# Patient Record
Sex: Male | Born: 1960 | Race: Black or African American | Hispanic: No | Marital: Married | Smoking: Never smoker
Health system: Southern US, Community
[De-identification: ages and names within clinical notes are randomized; demographics above are authoritative.]

## PROBLEM LIST (undated history)

## (undated) DIAGNOSIS — E785 Hyperlipidemia, unspecified: Secondary | ICD-10-CM

## (undated) DIAGNOSIS — R011 Cardiac murmur, unspecified: Secondary | ICD-10-CM

## (undated) DIAGNOSIS — I10 Essential (primary) hypertension: Secondary | ICD-10-CM

## (undated) DIAGNOSIS — T7840XA Allergy, unspecified, initial encounter: Secondary | ICD-10-CM

## (undated) DIAGNOSIS — K219 Gastro-esophageal reflux disease without esophagitis: Secondary | ICD-10-CM

## (undated) HISTORY — DX: Allergy, unspecified, initial encounter: T78.40XA

## (undated) HISTORY — DX: Cardiac murmur, unspecified: R01.1

## (undated) HISTORY — DX: Gastro-esophageal reflux disease without esophagitis: K21.9

## (undated) HISTORY — DX: Hyperlipidemia, unspecified: E78.5

---

## 2007-11-24 ENCOUNTER — Emergency Department (HOSPITAL_COMMUNITY): Admission: EM | Admit: 2007-11-24 | Discharge: 2007-11-24 | Payer: Self-pay | Admitting: Family Medicine

## 2009-02-20 HISTORY — PX: COLONOSCOPY: SHX174

## 2013-01-27 ENCOUNTER — Emergency Department (HOSPITAL_COMMUNITY)
Admission: EM | Admit: 2013-01-27 | Discharge: 2013-01-27 | Disposition: A | Discharge status: Home / Self Care | Payer: Federal, State, Local not specified - PPO | Attending: Emergency Medicine | Admitting: Emergency Medicine

## 2013-01-27 ENCOUNTER — Encounter (HOSPITAL_COMMUNITY): Payer: Self-pay | Admitting: Emergency Medicine

## 2013-01-27 DIAGNOSIS — J3489 Other specified disorders of nose and nasal sinuses: Secondary | ICD-10-CM

## 2013-01-27 DIAGNOSIS — M542 Cervicalgia: Secondary | ICD-10-CM | POA: Insufficient documentation

## 2013-01-27 DIAGNOSIS — H9319 Tinnitus, unspecified ear: Secondary | ICD-10-CM | POA: Insufficient documentation

## 2013-01-27 DIAGNOSIS — I10 Essential (primary) hypertension: Secondary | ICD-10-CM | POA: Insufficient documentation

## 2013-01-27 DIAGNOSIS — H9209 Otalgia, unspecified ear: Secondary | ICD-10-CM | POA: Insufficient documentation

## 2013-01-27 DIAGNOSIS — R599 Enlarged lymph nodes, unspecified: Secondary | ICD-10-CM | POA: Insufficient documentation

## 2013-01-27 HISTORY — DX: Essential (primary) hypertension: I10

## 2013-01-27 MED ORDER — AMOXICILLIN 500 MG PO CAPS: 500.0000 mg | ORAL_CAPSULE | Freq: Three times a day (TID) | ORAL | Status: DC

## 2013-01-27 MED ORDER — GUAIFENESIN ER 600 MG PO TB12: 600.0000 mg | ORAL_TABLET | Freq: Two times a day (BID) | ORAL | Status: AC | PRN

## 2013-01-27 MED ORDER — HYDROCODONE-ACETAMINOPHEN 5-500 MG PO TABS: 1.0000 | ORAL_TABLET | Freq: Four times a day (QID) | ORAL | Status: DC | PRN

## 2013-01-27 NOTE — ED Provider Notes (Signed)
CSN: [id number]     Arrival date & time 01/27/13  1232 History   First MD Initiated Contact with Patient 01/27/13 1306    This chart was scribed for Fayrene Helper PA-C, a non-physician practitioner working with Darlys Gales, MD by Lewanda Rife, ED Scribe. This patient was seen in room TR05C/TR05C and the patient's care was started at 1:13 PM     Chief Complaint  Patient presents with  . Facial Pain   (Consider location/radiation/quality/duration/timing/severity/associated sxs/prior Treatment) The history is provided by the patient. No language interpreter was used.   HPI Comments: Micheal Potts is a 52 y.o. male who presents to the Emergency Department complaining of constant worsening left sided facial pain radiating to left ear onset 2 days. Reports associated nasal congestion, intermittent tinnitus, left sided neck pain, and sinus pressure. Describes pain as throbbing and moderate in severity. Reports trying OTC sinus medications, and Afrin with no relief of symptoms. Denies associated injury, fever, rash, chills, dental pain, diplopia, chest pain, shortness of breath, nausea, emesis, and diarrhea.  Reports PMHx of recurrent sinus infections.  Past Medical History  Diagnosis Date  . Hypertension    History reviewed. No pertinent past surgical history. No family history on file. History  Substance Use Topics  . Smoking status: Never Smoker   . Smokeless tobacco: Not on file  . Alcohol Use: No    Review of Systems  Constitutional: Negative for fever and chills.  HENT: Positive for ear pain. Negative for dental problem.   Eyes: Negative for visual disturbance.  Respiratory: Negative for shortness of breath.   Cardiovascular: Negative for chest pain.  Gastrointestinal: Negative for nausea, vomiting and diarrhea.  Musculoskeletal: Negative for neck pain.  Skin: Negative for rash.  Neurological: Negative for numbness.  Hematological: Positive for adenopathy.   Psychiatric/Behavioral: Negative for confusion.    Allergies  Review of patient's allergies indicates no known allergies.  Home Medications  No current outpatient prescriptions on file. BP 158/103  Pulse 63  Temp(Src) 98.8 F (37.1 C) (Oral)  Resp 14  Ht 5\' 10"  (1.778 m)  Wt 215 lb (97.523 kg)  BMI 30.85 kg/m2  SpO2 100% Physical Exam  Nursing note and vitals reviewed. Constitutional: He is oriented to person, place, and time. He appears well-developed and well-nourished. No distress.  HENT:  Head: Normocephalic and atraumatic.  Right Ear: External ear and ear canal normal. Tympanic membrane is bulging.  Left Ear: External ear and ear canal normal. Tympanic membrane is bulging.  Nose: Right sinus exhibits no maxillary sinus tenderness and no frontal sinus tenderness. Left sinus exhibits maxillary sinus tenderness and frontal sinus tenderness.  Mouth/Throat: Uvula is midline and oropharynx is clear and moist. No trismus in the jaw.  TMs are mildly dull and bulging bilaterally   Dental decay noted, but no obvious sign of infection   Eyes: Conjunctivae and EOM are normal.  Neck: Neck supple. No tracheal deviation present.  Cardiovascular: Normal rate and regular rhythm.   No murmur heard. Pulmonary/Chest: Effort normal and breath sounds normal. No respiratory distress. He has no wheezes.  Musculoskeletal: Normal range of motion.  Lymphadenopathy:    He has cervical adenopathy (mild ).  Neurological: He is alert and oriented to person, place, and time.  Skin: Skin is warm and dry.  Psychiatric: He has a normal mood and affect. His behavior is normal.    ED Course  Procedures (including critical care time)  COORDINATION OF CARE:  Nursing notes reviewed. Vital signs reviewed.  Initial pt interview and examination performed.  1:20 PM Pt informed of return precautions and is comfortable with discharge at this time.    1:49 PM P[t with sinus discomfort, likely viral. No  obvious finding on exam.  However pt report having recurrent sinus infection and request abx.  i recommend only start taking abx if sxs lasting longer than 10 days.  ENT referral given.  Return precaution discussed.    Treatment plan initiated:Medications - No data to display   Initial diagnostic testing ordered.    Labs Review Labs Reviewed - No data to display Imaging Review No results found.  EKG Interpretation   None       MDM   1. Sinus pain    BP 158/103  Pulse 63  Temp(Src) 98.8 F (37.1 C) (Oral)  Resp 14  Ht 5\' 10"  (1.778 m)  Wt 215 lb (97.523 kg)  BMI 30.85 kg/m2  SpO2 100%   I personally performed the services described in this documentation, which was scribed in my presence. The recorded information has been reviewed and is accurate.     Fayrene Helper, PA-C 01/27/13 1350

## 2013-01-27 NOTE — ED Notes (Signed)
Pt. reports left facial pain/ sinus pressure with nasal congestion onset 2 days ago . Denies injury. No fever or chills.

## 2013-01-29 NOTE — ED Provider Notes (Signed)
Medical screening examination/treatment/procedure(s) were performed by non-physician practitioner and as supervising physician I was immediately available for consultation/collaboration.  EKG Interpretation   None         David Masneri, MD 01/29/13 0631 

## 2015-06-08 DIAGNOSIS — K080 Exfoliation of teeth due to systemic causes: Secondary | ICD-10-CM | POA: Diagnosis not present

## 2015-08-10 DIAGNOSIS — K080 Exfoliation of teeth due to systemic causes: Secondary | ICD-10-CM | POA: Diagnosis not present

## 2015-08-16 DIAGNOSIS — J0110 Acute frontal sinusitis, unspecified: Secondary | ICD-10-CM | POA: Diagnosis not present

## 2015-08-23 DIAGNOSIS — J329 Chronic sinusitis, unspecified: Secondary | ICD-10-CM | POA: Diagnosis not present

## 2015-09-30 DIAGNOSIS — K080 Exfoliation of teeth due to systemic causes: Secondary | ICD-10-CM | POA: Diagnosis not present

## 2015-11-16 DIAGNOSIS — R7303 Prediabetes: Secondary | ICD-10-CM | POA: Diagnosis not present

## 2015-11-16 DIAGNOSIS — Z23 Encounter for immunization: Secondary | ICD-10-CM | POA: Diagnosis not present

## 2015-11-16 DIAGNOSIS — E7800 Pure hypercholesterolemia, unspecified: Secondary | ICD-10-CM | POA: Diagnosis not present

## 2015-11-16 DIAGNOSIS — I10 Essential (primary) hypertension: Secondary | ICD-10-CM | POA: Diagnosis not present

## 2015-12-15 DIAGNOSIS — K080 Exfoliation of teeth due to systemic causes: Secondary | ICD-10-CM | POA: Diagnosis not present

## 2016-01-25 DIAGNOSIS — K080 Exfoliation of teeth due to systemic causes: Secondary | ICD-10-CM | POA: Diagnosis not present

## 2016-05-11 DIAGNOSIS — Z125 Encounter for screening for malignant neoplasm of prostate: Secondary | ICD-10-CM | POA: Diagnosis not present

## 2016-05-11 DIAGNOSIS — Z0000 Encounter for general adult medical examination without abnormal findings: Secondary | ICD-10-CM | POA: Diagnosis not present

## 2016-05-16 DIAGNOSIS — E7800 Pure hypercholesterolemia, unspecified: Secondary | ICD-10-CM | POA: Diagnosis not present

## 2016-05-16 DIAGNOSIS — Z1212 Encounter for screening for malignant neoplasm of rectum: Secondary | ICD-10-CM | POA: Diagnosis not present

## 2016-05-16 DIAGNOSIS — J309 Allergic rhinitis, unspecified: Secondary | ICD-10-CM | POA: Diagnosis not present

## 2016-05-16 DIAGNOSIS — Z0000 Encounter for general adult medical examination without abnormal findings: Secondary | ICD-10-CM | POA: Diagnosis not present

## 2016-05-16 DIAGNOSIS — N200 Calculus of kidney: Secondary | ICD-10-CM | POA: Diagnosis not present

## 2016-05-30 DIAGNOSIS — H2513 Age-related nuclear cataract, bilateral: Secondary | ICD-10-CM | POA: Diagnosis not present

## 2017-01-17 DIAGNOSIS — Z125 Encounter for screening for malignant neoplasm of prostate: Secondary | ICD-10-CM | POA: Diagnosis not present

## 2017-01-17 DIAGNOSIS — R7303 Prediabetes: Secondary | ICD-10-CM | POA: Diagnosis not present

## 2017-01-17 DIAGNOSIS — Z0000 Encounter for general adult medical examination without abnormal findings: Secondary | ICD-10-CM | POA: Diagnosis not present

## 2017-04-10 DIAGNOSIS — R972 Elevated prostate specific antigen [PSA]: Secondary | ICD-10-CM | POA: Diagnosis not present

## 2017-04-10 DIAGNOSIS — N5201 Erectile dysfunction due to arterial insufficiency: Secondary | ICD-10-CM | POA: Diagnosis not present

## 2017-05-23 DIAGNOSIS — E7800 Pure hypercholesterolemia, unspecified: Secondary | ICD-10-CM | POA: Diagnosis not present

## 2017-05-23 DIAGNOSIS — I10 Essential (primary) hypertension: Secondary | ICD-10-CM | POA: Diagnosis not present

## 2017-05-28 DIAGNOSIS — J309 Allergic rhinitis, unspecified: Secondary | ICD-10-CM | POA: Diagnosis not present

## 2017-05-28 DIAGNOSIS — E7800 Pure hypercholesterolemia, unspecified: Secondary | ICD-10-CM | POA: Diagnosis not present

## 2017-05-28 DIAGNOSIS — Z0000 Encounter for general adult medical examination without abnormal findings: Secondary | ICD-10-CM | POA: Diagnosis not present

## 2017-05-28 DIAGNOSIS — N200 Calculus of kidney: Secondary | ICD-10-CM | POA: Diagnosis not present

## 2017-06-01 DIAGNOSIS — J342 Deviated nasal septum: Secondary | ICD-10-CM | POA: Diagnosis not present

## 2017-06-01 DIAGNOSIS — J301 Allergic rhinitis due to pollen: Secondary | ICD-10-CM | POA: Diagnosis not present

## 2017-06-01 DIAGNOSIS — H6523 Chronic serous otitis media, bilateral: Secondary | ICD-10-CM | POA: Diagnosis not present

## 2017-10-01 DIAGNOSIS — R972 Elevated prostate specific antigen [PSA]: Secondary | ICD-10-CM | POA: Diagnosis not present

## 2017-11-12 DIAGNOSIS — N5201 Erectile dysfunction due to arterial insufficiency: Secondary | ICD-10-CM | POA: Diagnosis not present

## 2017-11-12 DIAGNOSIS — R972 Elevated prostate specific antigen [PSA]: Secondary | ICD-10-CM | POA: Diagnosis not present

## 2017-11-21 DIAGNOSIS — R339 Retention of urine, unspecified: Secondary | ICD-10-CM | POA: Diagnosis not present

## 2017-11-21 DIAGNOSIS — Z79899 Other long term (current) drug therapy: Secondary | ICD-10-CM | POA: Diagnosis not present

## 2017-11-21 DIAGNOSIS — N12 Tubulo-interstitial nephritis, not specified as acute or chronic: Secondary | ICD-10-CM | POA: Diagnosis not present

## 2017-11-21 DIAGNOSIS — M545 Low back pain: Secondary | ICD-10-CM | POA: Diagnosis not present

## 2017-11-21 DIAGNOSIS — R300 Dysuria: Secondary | ICD-10-CM | POA: Diagnosis not present

## 2017-11-21 DIAGNOSIS — Z5181 Encounter for therapeutic drug level monitoring: Secondary | ICD-10-CM | POA: Diagnosis not present

## 2017-11-21 DIAGNOSIS — R1031 Right lower quadrant pain: Secondary | ICD-10-CM | POA: Diagnosis not present

## 2017-11-26 DIAGNOSIS — Z01818 Encounter for other preprocedural examination: Secondary | ICD-10-CM | POA: Diagnosis not present

## 2017-11-26 DIAGNOSIS — N950 Postmenopausal bleeding: Secondary | ICD-10-CM | POA: Diagnosis not present

## 2017-11-26 DIAGNOSIS — E119 Type 2 diabetes mellitus without complications: Secondary | ICD-10-CM | POA: Diagnosis not present

## 2017-11-26 DIAGNOSIS — R21 Rash and other nonspecific skin eruption: Secondary | ICD-10-CM | POA: Diagnosis not present

## 2017-11-26 DIAGNOSIS — I10 Essential (primary) hypertension: Secondary | ICD-10-CM | POA: Diagnosis not present

## 2017-11-26 DIAGNOSIS — D259 Leiomyoma of uterus, unspecified: Secondary | ICD-10-CM | POA: Diagnosis not present

## 2017-11-27 DIAGNOSIS — I10 Essential (primary) hypertension: Secondary | ICD-10-CM | POA: Diagnosis not present

## 2017-11-27 DIAGNOSIS — N12 Tubulo-interstitial nephritis, not specified as acute or chronic: Secondary | ICD-10-CM | POA: Diagnosis not present

## 2017-12-10 DIAGNOSIS — Z87442 Personal history of urinary calculi: Secondary | ICD-10-CM | POA: Diagnosis not present

## 2017-12-10 DIAGNOSIS — N390 Urinary tract infection, site not specified: Secondary | ICD-10-CM | POA: Diagnosis not present

## 2018-05-30 DIAGNOSIS — Z125 Encounter for screening for malignant neoplasm of prostate: Secondary | ICD-10-CM | POA: Diagnosis not present

## 2018-05-30 DIAGNOSIS — Z1159 Encounter for screening for other viral diseases: Secondary | ICD-10-CM | POA: Diagnosis not present

## 2018-05-30 DIAGNOSIS — Z0000 Encounter for general adult medical examination without abnormal findings: Secondary | ICD-10-CM | POA: Diagnosis not present

## 2018-05-30 DIAGNOSIS — N390 Urinary tract infection, site not specified: Secondary | ICD-10-CM | POA: Diagnosis not present

## 2018-06-05 DIAGNOSIS — E7800 Pure hypercholesterolemia, unspecified: Secondary | ICD-10-CM | POA: Diagnosis not present

## 2018-06-05 DIAGNOSIS — Z23 Encounter for immunization: Secondary | ICD-10-CM | POA: Diagnosis not present

## 2018-06-05 DIAGNOSIS — Z0000 Encounter for general adult medical examination without abnormal findings: Secondary | ICD-10-CM | POA: Diagnosis not present

## 2018-07-08 DIAGNOSIS — N390 Urinary tract infection, site not specified: Secondary | ICD-10-CM | POA: Diagnosis not present

## 2018-07-08 DIAGNOSIS — Z79899 Other long term (current) drug therapy: Secondary | ICD-10-CM | POA: Diagnosis not present

## 2018-08-19 DIAGNOSIS — I10 Essential (primary) hypertension: Secondary | ICD-10-CM | POA: Diagnosis not present

## 2018-08-19 DIAGNOSIS — R972 Elevated prostate specific antigen [PSA]: Secondary | ICD-10-CM | POA: Diagnosis not present

## 2018-08-20 DIAGNOSIS — R972 Elevated prostate specific antigen [PSA]: Secondary | ICD-10-CM | POA: Diagnosis not present

## 2018-08-20 DIAGNOSIS — R7303 Prediabetes: Secondary | ICD-10-CM | POA: Diagnosis not present

## 2018-08-20 DIAGNOSIS — Z23 Encounter for immunization: Secondary | ICD-10-CM | POA: Diagnosis not present

## 2018-12-17 DIAGNOSIS — N529 Male erectile dysfunction, unspecified: Secondary | ICD-10-CM | POA: Diagnosis not present

## 2018-12-17 DIAGNOSIS — Z23 Encounter for immunization: Secondary | ICD-10-CM | POA: Diagnosis not present

## 2018-12-17 DIAGNOSIS — I10 Essential (primary) hypertension: Secondary | ICD-10-CM | POA: Diagnosis not present

## 2018-12-17 DIAGNOSIS — Z6832 Body mass index (BMI) 32.0-32.9, adult: Secondary | ICD-10-CM | POA: Diagnosis not present

## 2019-02-19 DIAGNOSIS — Z23 Encounter for immunization: Secondary | ICD-10-CM | POA: Diagnosis not present

## 2019-03-19 DIAGNOSIS — Z23 Encounter for immunization: Secondary | ICD-10-CM | POA: Diagnosis not present

## 2019-05-07 DIAGNOSIS — J019 Acute sinusitis, unspecified: Secondary | ICD-10-CM | POA: Diagnosis not present

## 2019-06-05 DIAGNOSIS — Z0000 Encounter for general adult medical examination without abnormal findings: Secondary | ICD-10-CM | POA: Diagnosis not present

## 2019-06-05 DIAGNOSIS — Z79899 Other long term (current) drug therapy: Secondary | ICD-10-CM | POA: Diagnosis not present

## 2019-06-05 DIAGNOSIS — E7800 Pure hypercholesterolemia, unspecified: Secondary | ICD-10-CM | POA: Diagnosis not present

## 2019-06-11 DIAGNOSIS — E7800 Pure hypercholesterolemia, unspecified: Secondary | ICD-10-CM | POA: Diagnosis not present

## 2019-06-11 DIAGNOSIS — Z6833 Body mass index (BMI) 33.0-33.9, adult: Secondary | ICD-10-CM | POA: Diagnosis not present

## 2019-06-11 DIAGNOSIS — Z1212 Encounter for screening for malignant neoplasm of rectum: Secondary | ICD-10-CM | POA: Diagnosis not present

## 2019-06-11 DIAGNOSIS — Z125 Encounter for screening for malignant neoplasm of prostate: Secondary | ICD-10-CM | POA: Diagnosis not present

## 2019-06-11 DIAGNOSIS — Z0000 Encounter for general adult medical examination without abnormal findings: Secondary | ICD-10-CM | POA: Diagnosis not present

## 2019-06-24 DIAGNOSIS — R7303 Prediabetes: Secondary | ICD-10-CM | POA: Diagnosis not present

## 2019-06-24 DIAGNOSIS — E7800 Pure hypercholesterolemia, unspecified: Secondary | ICD-10-CM | POA: Diagnosis not present

## 2019-06-24 DIAGNOSIS — Z6833 Body mass index (BMI) 33.0-33.9, adult: Secondary | ICD-10-CM | POA: Diagnosis not present

## 2019-06-24 DIAGNOSIS — R7309 Other abnormal glucose: Secondary | ICD-10-CM | POA: Diagnosis not present

## 2019-07-06 DIAGNOSIS — J069 Acute upper respiratory infection, unspecified: Secondary | ICD-10-CM | POA: Diagnosis not present

## 2019-07-06 DIAGNOSIS — J019 Acute sinusitis, unspecified: Secondary | ICD-10-CM | POA: Diagnosis not present

## 2019-09-02 DIAGNOSIS — I10 Essential (primary) hypertension: Secondary | ICD-10-CM | POA: Diagnosis not present

## 2019-09-02 DIAGNOSIS — E7800 Pure hypercholesterolemia, unspecified: Secondary | ICD-10-CM | POA: Diagnosis not present

## 2019-09-02 DIAGNOSIS — R7303 Prediabetes: Secondary | ICD-10-CM | POA: Diagnosis not present

## 2019-09-18 DIAGNOSIS — T783XXA Angioneurotic edema, initial encounter: Secondary | ICD-10-CM | POA: Diagnosis not present

## 2019-11-11 DIAGNOSIS — I10 Essential (primary) hypertension: Secondary | ICD-10-CM | POA: Diagnosis not present

## 2019-11-11 DIAGNOSIS — Z23 Encounter for immunization: Secondary | ICD-10-CM | POA: Diagnosis not present

## 2019-11-11 DIAGNOSIS — R7303 Prediabetes: Secondary | ICD-10-CM | POA: Diagnosis not present

## 2019-11-11 DIAGNOSIS — E7800 Pure hypercholesterolemia, unspecified: Secondary | ICD-10-CM | POA: Diagnosis not present

## 2019-12-03 DIAGNOSIS — I10 Essential (primary) hypertension: Secondary | ICD-10-CM | POA: Diagnosis not present

## 2019-12-03 DIAGNOSIS — E7800 Pure hypercholesterolemia, unspecified: Secondary | ICD-10-CM | POA: Diagnosis not present

## 2019-12-03 DIAGNOSIS — R7303 Prediabetes: Secondary | ICD-10-CM | POA: Diagnosis not present

## 2019-12-03 DIAGNOSIS — Z6829 Body mass index (BMI) 29.0-29.9, adult: Secondary | ICD-10-CM | POA: Diagnosis not present

## 2019-12-25 DIAGNOSIS — I10 Essential (primary) hypertension: Secondary | ICD-10-CM | POA: Diagnosis not present

## 2020-03-04 DIAGNOSIS — I10 Essential (primary) hypertension: Secondary | ICD-10-CM | POA: Diagnosis not present

## 2020-03-04 DIAGNOSIS — Z6829 Body mass index (BMI) 29.0-29.9, adult: Secondary | ICD-10-CM | POA: Diagnosis not present

## 2020-03-04 DIAGNOSIS — E7800 Pure hypercholesterolemia, unspecified: Secondary | ICD-10-CM | POA: Diagnosis not present

## 2020-03-04 DIAGNOSIS — R7303 Prediabetes: Secondary | ICD-10-CM | POA: Diagnosis not present

## 2020-05-15 DIAGNOSIS — Z79899 Other long term (current) drug therapy: Secondary | ICD-10-CM | POA: Diagnosis not present

## 2020-05-15 DIAGNOSIS — R200 Anesthesia of skin: Secondary | ICD-10-CM | POA: Diagnosis not present

## 2020-05-15 DIAGNOSIS — J309 Allergic rhinitis, unspecified: Secondary | ICD-10-CM | POA: Diagnosis not present

## 2020-05-15 DIAGNOSIS — M5416 Radiculopathy, lumbar region: Secondary | ICD-10-CM | POA: Diagnosis not present

## 2020-05-15 DIAGNOSIS — R7303 Prediabetes: Secondary | ICD-10-CM | POA: Diagnosis not present

## 2020-05-15 DIAGNOSIS — N529 Male erectile dysfunction, unspecified: Secondary | ICD-10-CM | POA: Diagnosis not present

## 2020-05-15 DIAGNOSIS — M25552 Pain in left hip: Secondary | ICD-10-CM | POA: Diagnosis not present

## 2020-05-15 DIAGNOSIS — M4316 Spondylolisthesis, lumbar region: Secondary | ICD-10-CM | POA: Diagnosis not present

## 2020-05-15 DIAGNOSIS — M5432 Sciatica, left side: Secondary | ICD-10-CM | POA: Diagnosis not present

## 2020-05-15 DIAGNOSIS — I10 Essential (primary) hypertension: Secondary | ICD-10-CM | POA: Diagnosis not present

## 2020-05-15 DIAGNOSIS — Z7952 Long term (current) use of systemic steroids: Secondary | ICD-10-CM | POA: Diagnosis not present

## 2020-05-15 DIAGNOSIS — R0902 Hypoxemia: Secondary | ICD-10-CM | POA: Diagnosis not present

## 2020-05-15 DIAGNOSIS — Z20822 Contact with and (suspected) exposure to covid-19: Secondary | ICD-10-CM | POA: Diagnosis not present

## 2020-05-15 DIAGNOSIS — E785 Hyperlipidemia, unspecified: Secondary | ICD-10-CM | POA: Diagnosis not present

## 2020-05-15 DIAGNOSIS — M48061 Spinal stenosis, lumbar region without neurogenic claudication: Secondary | ICD-10-CM | POA: Diagnosis not present

## 2020-05-15 DIAGNOSIS — Z8616 Personal history of COVID-19: Secondary | ICD-10-CM | POA: Diagnosis not present

## 2020-05-15 DIAGNOSIS — R531 Weakness: Secondary | ICD-10-CM | POA: Diagnosis not present

## 2020-05-15 DIAGNOSIS — R202 Paresthesia of skin: Secondary | ICD-10-CM | POA: Diagnosis not present

## 2020-05-16 DIAGNOSIS — M25552 Pain in left hip: Secondary | ICD-10-CM | POA: Diagnosis not present

## 2020-05-16 DIAGNOSIS — M4317 Spondylolisthesis, lumbosacral region: Secondary | ICD-10-CM | POA: Diagnosis not present

## 2020-05-16 DIAGNOSIS — I10 Essential (primary) hypertension: Secondary | ICD-10-CM | POA: Diagnosis not present

## 2020-05-16 DIAGNOSIS — M5117 Intervertebral disc disorders with radiculopathy, lumbosacral region: Secondary | ICD-10-CM | POA: Diagnosis not present

## 2020-05-16 DIAGNOSIS — E785 Hyperlipidemia, unspecified: Secondary | ICD-10-CM | POA: Diagnosis not present

## 2020-05-16 DIAGNOSIS — M5416 Radiculopathy, lumbar region: Secondary | ICD-10-CM | POA: Diagnosis not present

## 2020-05-17 DIAGNOSIS — M5416 Radiculopathy, lumbar region: Secondary | ICD-10-CM | POA: Diagnosis not present

## 2020-05-17 DIAGNOSIS — M25552 Pain in left hip: Secondary | ICD-10-CM | POA: Diagnosis not present

## 2020-05-17 DIAGNOSIS — E785 Hyperlipidemia, unspecified: Secondary | ICD-10-CM | POA: Diagnosis not present

## 2020-05-17 DIAGNOSIS — I10 Essential (primary) hypertension: Secondary | ICD-10-CM | POA: Diagnosis not present

## 2020-05-18 DIAGNOSIS — M25552 Pain in left hip: Secondary | ICD-10-CM | POA: Diagnosis not present

## 2020-05-18 DIAGNOSIS — E785 Hyperlipidemia, unspecified: Secondary | ICD-10-CM | POA: Diagnosis not present

## 2020-05-18 DIAGNOSIS — I10 Essential (primary) hypertension: Secondary | ICD-10-CM | POA: Diagnosis not present

## 2020-05-18 DIAGNOSIS — M5416 Radiculopathy, lumbar region: Secondary | ICD-10-CM | POA: Diagnosis not present

## 2020-05-26 DIAGNOSIS — M5442 Lumbago with sciatica, left side: Secondary | ICD-10-CM | POA: Diagnosis not present

## 2020-06-11 DIAGNOSIS — I10 Essential (primary) hypertension: Secondary | ICD-10-CM | POA: Diagnosis not present

## 2020-06-11 DIAGNOSIS — R7309 Other abnormal glucose: Secondary | ICD-10-CM | POA: Diagnosis not present

## 2020-06-11 DIAGNOSIS — E7800 Pure hypercholesterolemia, unspecified: Secondary | ICD-10-CM | POA: Diagnosis not present

## 2020-06-11 DIAGNOSIS — R972 Elevated prostate specific antigen [PSA]: Secondary | ICD-10-CM | POA: Diagnosis not present

## 2020-06-14 DIAGNOSIS — M5442 Lumbago with sciatica, left side: Secondary | ICD-10-CM | POA: Diagnosis not present

## 2020-06-16 ENCOUNTER — Other Ambulatory Visit: Payer: Self-pay | Admitting: Internal Medicine

## 2020-06-16 DIAGNOSIS — E7800 Pure hypercholesterolemia, unspecified: Secondary | ICD-10-CM

## 2020-06-16 DIAGNOSIS — Z1212 Encounter for screening for malignant neoplasm of rectum: Secondary | ICD-10-CM | POA: Diagnosis not present

## 2020-06-16 DIAGNOSIS — S50819A Abrasion of unspecified forearm, initial encounter: Secondary | ICD-10-CM | POA: Diagnosis not present

## 2020-06-16 DIAGNOSIS — N529 Male erectile dysfunction, unspecified: Secondary | ICD-10-CM | POA: Diagnosis not present

## 2020-06-16 DIAGNOSIS — I10 Essential (primary) hypertension: Secondary | ICD-10-CM | POA: Diagnosis not present

## 2020-06-16 DIAGNOSIS — Z0000 Encounter for general adult medical examination without abnormal findings: Secondary | ICD-10-CM | POA: Diagnosis not present

## 2020-06-28 DIAGNOSIS — M5442 Lumbago with sciatica, left side: Secondary | ICD-10-CM | POA: Diagnosis not present

## 2020-07-01 LAB — COLOGUARD: Cologuard: POSITIVE — AB

## 2020-07-20 ENCOUNTER — Encounter: Payer: Self-pay | Admitting: Internal Medicine

## 2020-08-09 ENCOUNTER — Ambulatory Visit (AMBULATORY_SURGERY_CENTER): Payer: Federal, State, Local not specified - PPO | Admitting: *Deleted

## 2020-08-09 ENCOUNTER — Other Ambulatory Visit: Payer: Self-pay

## 2020-08-09 VITALS — Ht 69.25 in | Wt 190.0 lb

## 2020-08-09 DIAGNOSIS — R195 Other fecal abnormalities: Secondary | ICD-10-CM

## 2020-08-09 MED ORDER — PLENVU 140 G PO SOLR: 1.0000 | ORAL | 0 refills | Status: DC

## 2020-08-09 MED ORDER — ONDANSETRON HCL 4 MG PO TABS: 4.0000 mg | ORAL_TABLET | ORAL | 0 refills | Status: DC

## 2020-08-09 NOTE — Progress Notes (Signed)
No egg or soy allergy known to patient  No issues with past sedation with any surgeries or procedures Patient denies ever being told they had issues or difficulty with intubation  No FH of Malignant Hyperthermia No diet pills per patient No home 02 use per patient  No blood thinners per patient  Pt denies issues with constipation  No A fib or A flutter  EMMI video to pt or via MyChart  COVID 19 guidelines implemented in PV today with Pt and RN  Pt is fully vaccinated  for Covid   PLENVU  Coupon given to pt in PV today , Code to Pharmacy and  NO PA's for preps discussed with pt In PV today  Discussed with pt there will be an out-of-pocket cost for prep and that varies from $0 to 70 dollars   Due to the COVID-19 pandemic we are asking patients to follow certain guidelines.  Pt aware of COVID protocols and [facility] guidelines

## 2020-08-26 ENCOUNTER — Other Ambulatory Visit: Payer: Self-pay

## 2020-08-26 ENCOUNTER — Ambulatory Visit (AMBULATORY_SURGERY_CENTER): Payer: Federal, State, Local not specified - PPO | Admitting: Internal Medicine

## 2020-08-26 ENCOUNTER — Encounter: Payer: Self-pay | Admitting: Internal Medicine

## 2020-08-26 VITALS — BP 118/66 | HR 72 | Temp 97.5°F | Resp 14 | Ht 69.25 in | Wt 198.0 lb

## 2020-08-26 DIAGNOSIS — R195 Other fecal abnormalities: Secondary | ICD-10-CM

## 2020-08-26 DIAGNOSIS — Z538 Procedure and treatment not carried out for other reasons: Secondary | ICD-10-CM

## 2020-08-26 MED ORDER — METOCLOPRAMIDE HCL 10 MG PO TABS: 10.0000 mg | ORAL_TABLET | ORAL | 0 refills | Status: DC | PRN

## 2020-08-26 MED ORDER — SODIUM CHLORIDE 0.9 % IV SOLN: 500.0000 mL | Freq: Once | INTRAVENOUS | Status: DC

## 2020-08-26 NOTE — Progress Notes (Signed)
Report to PACU, RN, vss, BBS= Clear.  

## 2020-08-26 NOTE — Progress Notes (Signed)
Pt's states no medical or surgical changes since previsit or office visit.  ° °Vitals CW °

## 2020-08-26 NOTE — Op Note (Signed)
[facility] Patient Name: Micheal Potts Procedure Date: 08/26/2020 10:48 AM MRN: [id number] Endoscopist: Beverley Fiedler , MD Age: 60 Referring MD:  Date of Birth: 1960-10-18 Gender: Male Account #: [id number] Procedure:                Colonoscopy Indications:              Positive Cologuard test Medicines:                Monitored Anesthesia Care Procedure:                Pre-Anesthesia Assessment:                           - Prior to the procedure, a History and Physical                            was performed, and patient medications and                            allergies were reviewed. The patient's tolerance of                            previous anesthesia was also reviewed. The risks                            and benefits of the procedure and the sedation                            options and risks were discussed with the patient.                            All questions were answered, and informed consent                            was obtained. Prior Anticoagulants: The patient has                            taken no previous anticoagulant or antiplatelet                            agents. ASA Grade Assessment: II - A patient with                            mild systemic disease. After reviewing the risks                            and benefits, the patient was deemed in                            satisfactory condition to undergo the procedure.                           After obtaining informed consent, the colonoscope  was passed under direct vision. Throughout the                            procedure, the patient's blood pressure, pulse, and                            oxygen saturations were monitored continuously. The                            CF [id number] #[id number] was introduced through the anus                            with the intention of advancing to the cecum. The                            scope was advanced to the transverse colon  before                            the procedure was aborted. Medications were given.                            The colonoscopy was performed without difficulty.                            The patient tolerated the procedure well. The                            quality of the bowel preparation was poor. The                            rectum was photographed. Scope In: 11:04:25 AM Scope Out: 11:08:50 AM Total Procedure Duration: 0 hours 4 minutes 25 seconds  Findings:                 The digital rectal exam was normal.                           A moderate amount of semi-solid solid stool was                            found in the sigmoid colon, in the descending colon                            and in the transverse colon, precluding                            visualization. Procedure aborted due to poor                            visualization. Complications:            No immediate complications. Estimated Blood Loss:     Estimated blood loss: none. Impression:               - Preparation of the colon was poor.                           -  Stool in the sigmoid colon, in the descending                            colon and in the transverse colon.                           - No specimens collected. Recommendation:           - Patient has a contact number available for                            emergencies. The signs and symptoms of potential                            delayed complications were discussed with the                            patient. Return to normal activities tomorrow.                            Written discharge instructions were provided to the                            patient.                           - Clear liquid diet. Additional preparation tonight.                           - Continue present medications.                           - Repeat colonoscopy because the bowel preparation                            was poor. Spot for colonoscopy available tomorrow                             afternoon if patient agreeable. Beverley Fiedler, MD 08/26/2020 11:14:41 AM This report has been signed electronically.

## 2020-08-26 NOTE — Patient Instructions (Signed)
Return tomorrow at 1:30 Follow instruction for additional prep with Sutab  YOU HAD AN ENDOSCOPIC PROCEDURE TODAY AT THE [facility]:   Refer to the procedure report that was given to you for any specific questions about what was found during the examination.  If the procedure report does not answer your questions, please call your gastroenterologist to clarify.  If you requested that your care partner not be given the details of your procedure findings, then the procedure report has been included in a sealed envelope for you to review at your convenience later.  YOU SHOULD EXPECT: Some feelings of bloating in the abdomen. Passage of more gas than usual.  Walking can help get rid of the air that was put into your GI tract during the procedure and reduce the bloating. If you had a lower endoscopy (such as a colonoscopy or flexible sigmoidoscopy) you may notice spotting of blood in your stool or on the toilet paper. If you underwent a bowel prep for your procedure, you may not have a normal bowel movement for a few days.  Please Note:  You might notice some irritation and congestion in your nose or some drainage.  This is from the oxygen used during your procedure.  There is no need for concern and it should clear up in a day or so.  SYMPTOMS TO REPORT IMMEDIATELY:  Following lower endoscopy (colonoscopy or flexible sigmoidoscopy):  Excessive amounts of blood in the stool  Significant tenderness or worsening of abdominal pains  Swelling of the abdomen that is new, acute  Fever of 100F or higher  For urgent or emergent issues, a gastroenterologist can be reached at any hour by calling [phone]. Do not use MyChart messaging for urgent concerns.    DIET:  clear liquids today.  Drink plenty of fluids but you should avoid alcoholic beverages for 24 hours.  ACTIVITY:  You should plan to take it easy for the rest of today and you should NOT DRIVE or use heavy machinery until tomorrow  (because of the sedation medicines used during the test).    FOLLOW UP: Our staff will call the number listed on your records 48-72 hours following your procedure to check on you and address any questions or concerns that you may have regarding the information given to you following your procedure. If we do not reach you, we will leave a message.  We will attempt to reach you two times.  During this call, we will ask if you have developed any symptoms of COVID 19. If you develop any symptoms (ie: fever, flu-like symptoms, shortness of breath, cough etc.) before then, please call [phone].  If you test positive for Covid 19 in the 2 weeks post procedure, please call and report this information to [place].     SIGNATURES/CONFIDENTIALITY: You and/or your care partner have signed paperwork which will be entered into your electronic medical record.  These signatures attest to the fact that that the information above on your After Visit Summary has been reviewed and is understood.  Full responsibility of the confidentiality of this discharge information lies with you and/or your care-partner.

## 2020-08-26 NOTE — Progress Notes (Signed)
Pt with poor prep, scheduled for procedure 7/8 at 2:30 with Dr. Russella Dar. Sutab and instructions sent home with patient. Instructions reviewed with patient and caregiver.

## 2020-08-27 ENCOUNTER — Encounter: Payer: Self-pay | Admitting: Gastroenterology

## 2020-08-27 ENCOUNTER — Ambulatory Visit (AMBULATORY_SURGERY_CENTER): Payer: Federal, State, Local not specified - PPO | Admitting: Gastroenterology

## 2020-08-27 VITALS — BP 122/45 | HR 76 | Temp 98.6°F | Resp 18 | Ht 69.25 in | Wt 198.0 lb

## 2020-08-27 DIAGNOSIS — R195 Other fecal abnormalities: Secondary | ICD-10-CM | POA: Diagnosis not present

## 2020-08-27 MED ORDER — SODIUM CHLORIDE 0.9 % IV SOLN: 500.0000 mL | Freq: Once | INTRAVENOUS | Status: DC

## 2020-08-27 NOTE — Progress Notes (Signed)
Medical history reviewed with no changes noted. VS assessed by C.W 

## 2020-08-27 NOTE — Patient Instructions (Signed)
YOU HAD AN ENDOSCOPIC PROCEDURE TODAY AT THE [facility]:   Refer to the procedure report that was given to you for any specific questions about what was found during the examination.  If the procedure report does not answer your questions, please call your gastroenterologist to clarify.  If you requested that your care partner not be given the details of your procedure findings, then the procedure report has been included in a sealed envelope for you to review at your convenience later.  YOU SHOULD EXPECT: Some feelings of bloating in the abdomen. Passage of more gas than usual.  Walking can help get rid of the air that was put into your GI tract during the procedure and reduce the bloating. If you had a lower endoscopy (such as a colonoscopy or flexible sigmoidoscopy) you may notice spotting of blood in your stool or on the toilet paper. If you underwent a bowel prep for your procedure, you may not have a normal bowel movement for a few days.  Please Note:  You might notice some irritation and congestion in your nose or some drainage.  This is from the oxygen used during your procedure.  There is no need for concern and it should clear up in a day or so.  SYMPTOMS TO REPORT IMMEDIATELY:   Following lower endoscopy (colonoscopy or flexible sigmoidoscopy):  Excessive amounts of blood in the stool  Significant tenderness or worsening of abdominal pains  Swelling of the abdomen that is new, acute  Fever of 100F or higher   Following upper endoscopy (EGD)  Vomiting of blood or coffee ground material  New chest pain or pain under the shoulder blades  Painful or persistently difficult swallowing  New shortness of breath  Fever of 100F or higher  Black, tarry-looking stools  For urgent or emergent issues, a gastroenterologist can be reached at any hour by calling [phone]. Do not use MyChart messaging for urgent concerns.    DIET:  We do recommend a small meal at first, but  then you may proceed to your regular diet.  Drink plenty of fluids but you should avoid alcoholic beverages for 24 hours.  ACTIVITY:  You should plan to take it easy for the rest of today and you should NOT DRIVE or use heavy machinery until tomorrow (because of the sedation medicines used during the test).    FOLLOW UP: Our staff will call the number listed on your records 48-72 hours following your procedure to check on you and address any questions or concerns that you may have regarding the information given to you following your procedure. If we do not reach you, we will leave a message.  We will attempt to reach you two times.  During this call, we will ask if you have developed any symptoms of COVID 19. If you develop any symptoms (ie: fever, flu-like symptoms, shortness of breath, cough etc.) before then, please call [phone].  If you test positive for Covid 19 in the 2 weeks post procedure, please call and report this information to us.    If any biopsies were taken you will be contacted by phone or by letter within the next 1-3 weeks.  Please call us at [phone] if you have not heard about the biopsies in 3 weeks.    SIGNATURES/CONFIDENTIALITY: You and/or your care partner have signed paperwork which will be entered into your electronic medical record.  These signatures attest to the fact that that the information above on   your After Visit Summary has been reviewed and is understood.  Full responsibility of the confidentiality of this discharge information lies with you and/or your care-partner. 

## 2020-08-27 NOTE — Progress Notes (Signed)
pt tolerated well. VSS. awake and to recovery. Report given to RN.  

## 2020-08-27 NOTE — Op Note (Signed)
[facility] Patient Name: Micheal Potts Procedure Date: 08/27/2020 2:41 PM MRN: [id number] Endoscopist: Meryl Dare , MD Age: 60 Referring MD:  Date of Birth: February 22, 1960 Gender: Male Account #: [id number] Procedure:                Colonoscopy Indications:              Positive Cologuard test Medicines:                Monitored Anesthesia Care Procedure:                Pre-Anesthesia Assessment:                           - Prior to the procedure, a History and Physical                            was performed, and patient medications and                            allergies were reviewed. The patient's tolerance of                            previous anesthesia was also reviewed. The risks                            and benefits of the procedure and the sedation                            options and risks were discussed with the patient.                            All questions were answered, and informed consent                            was obtained. Prior Anticoagulants: The patient has                            taken no previous anticoagulant or antiplatelet                            agents. ASA Grade Assessment: II - A patient with                            mild systemic disease. After reviewing the risks                            and benefits, the patient was deemed in                            satisfactory condition to undergo the procedure.                           After obtaining informed consent, the colonoscope  was passed under direct vision. Throughout the                            procedure, the patient's blood pressure, pulse, and                            oxygen saturations were monitored continuously. The                            Olympus CF-HQ190L [id number]) Colonoscope was                            introduced through the anus and advanced to the the                            cecum, identified by appendiceal orifice and                             ileocecal valve. The ileocecal valve, appendiceal                            orifice, and rectum were photographed. The quality                            of the bowel preparation was adequate after very                            extensive lavage and suction. The colonoscopy was                            performed without difficulty. The patient tolerated                            the procedure well. Scope In: 2:47:10 PM Scope Out: 3:14:11 PM Scope Withdrawal Time: 0 hours 22 minutes 52 seconds  Total Procedure Duration: 0 hours 27 minutes 1 second  Findings:                 The perianal and digital rectal examinations were                            normal.                           The entire examined colon appeared normal on direct                            and retroflexion views. Complications:            No immediate complications. Estimated blood loss:                            None. Estimated Blood Loss:     Estimated blood loss: none. Impression:               - The entire examined colon is normal on direct  and                            retroflexion views.                           - No specimens collected. Recommendation:           - Repeat colonoscopy in 10 years for screening                            purposes with an extended bowel prep with Dr.                            Rhea Belton.                           - Patient has a contact number available for                            emergencies. The signs and symptoms of potential                            delayed complications were discussed with the                            patient. Return to normal activities tomorrow.                            Written discharge instructions were provided to the                            patient.                           - Resume previous diet.                           - Continue present medications. Meryl Dare, MD 08/27/2020 3:17:33 PM This report  has been signed electronically.

## 2020-08-31 ENCOUNTER — Telehealth: Payer: Self-pay | Admitting: *Deleted

## 2020-08-31 NOTE — Telephone Encounter (Signed)
Have you developed a fever since your procedure? no  2.   Have you had an respiratory symptoms (SOB or cough) since your procedure? no 3.   Have you tested positive for COVID 19 since your procedure no  4.   Have you had any family members/close contacts diagnosed with the COVID 19 since your procedure?  no   If yes to any of these questions please route to Laverna Peace, RN and Karlton Lemon, RN  Follow up Call-  Call back number 08/27/2020 08/26/2020  Post procedure Call Back phone  # [phone]  Permission to leave phone message Yes Yes  Some recent data might be hidden     Patient questions:  Do you have a fever, pain , or abdominal swelling? No. Pain Score  0 *  Have you tolerated food without any problems? Yes.    Have you been able to return to your normal activities? yes  Do you have any questions about your discharge instructions: Diet   No. Medications  No. Follow up visit  No.  Do you have questions or concerns about your Care? No.  Actions: * If pain score is 4 or above: No action needed, pain <4.

## 2020-09-13 ENCOUNTER — Ambulatory Visit
Admission: RE | Admit: 2020-09-13 | Discharge: 2020-09-13 | Disposition: A | Discharge status: Home / Self Care | Payer: Federal, State, Local not specified - PPO | Source: Ambulatory Visit | Attending: Internal Medicine | Admitting: Internal Medicine

## 2020-09-13 DIAGNOSIS — E7800 Pure hypercholesterolemia, unspecified: Secondary | ICD-10-CM

## 2020-09-15 DIAGNOSIS — I2510 Atherosclerotic heart disease of native coronary artery without angina pectoris: Secondary | ICD-10-CM | POA: Diagnosis not present

## 2020-09-15 DIAGNOSIS — E7800 Pure hypercholesterolemia, unspecified: Secondary | ICD-10-CM | POA: Diagnosis not present

## 2020-09-15 DIAGNOSIS — I10 Essential (primary) hypertension: Secondary | ICD-10-CM | POA: Diagnosis not present

## 2020-09-15 DIAGNOSIS — R7309 Other abnormal glucose: Secondary | ICD-10-CM | POA: Diagnosis not present

## 2020-11-03 DIAGNOSIS — R519 Headache, unspecified: Secondary | ICD-10-CM | POA: Diagnosis not present

## 2020-12-16 DIAGNOSIS — E7800 Pure hypercholesterolemia, unspecified: Secondary | ICD-10-CM | POA: Diagnosis not present

## 2020-12-16 DIAGNOSIS — R7309 Other abnormal glucose: Secondary | ICD-10-CM | POA: Diagnosis not present

## 2020-12-16 DIAGNOSIS — I2510 Atherosclerotic heart disease of native coronary artery without angina pectoris: Secondary | ICD-10-CM | POA: Diagnosis not present

## 2020-12-16 DIAGNOSIS — I10 Essential (primary) hypertension: Secondary | ICD-10-CM | POA: Diagnosis not present

## 2021-01-04 ENCOUNTER — Ambulatory Visit: Payer: No Typology Code available for payment source | Admitting: Cardiology

## 2021-02-02 ENCOUNTER — Ambulatory Visit: Payer: No Typology Code available for payment source | Admitting: Cardiology

## 2021-02-24 ENCOUNTER — Encounter: Payer: Self-pay | Admitting: Cardiology

## 2021-02-24 ENCOUNTER — Other Ambulatory Visit: Payer: Self-pay

## 2021-02-24 ENCOUNTER — Ambulatory Visit (INDEPENDENT_AMBULATORY_CARE_PROVIDER_SITE_OTHER): Payer: Federal, State, Local not specified - PPO | Admitting: Cardiology

## 2021-02-24 VITALS — BP 114/82 | HR 70 | Ht 69.25 in | Wt 196.4 lb

## 2021-02-24 DIAGNOSIS — R7303 Prediabetes: Secondary | ICD-10-CM

## 2021-02-24 DIAGNOSIS — E782 Mixed hyperlipidemia: Secondary | ICD-10-CM | POA: Diagnosis not present

## 2021-02-24 DIAGNOSIS — I10 Essential (primary) hypertension: Secondary | ICD-10-CM

## 2021-02-24 DIAGNOSIS — I2510 Atherosclerotic heart disease of native coronary artery without angina pectoris: Secondary | ICD-10-CM | POA: Diagnosis not present

## 2021-02-24 NOTE — Patient Instructions (Signed)

## 2021-02-24 NOTE — Progress Notes (Signed)
Cardiology Office Note:    Date:  02/24/2021   ID:  Micheal CruiseAlan Potts, DOB 04/02/1960, MRN [id number]  PCP:  Merri BrunettePharr, Walter, MD  Cardiologist:  Thomasene RippleKardie Beaulah Romanek, DO  Electrophysiologist:  None   Referring MD: Merri BrunettePharr, Walter, MD   " I am doing well"  History of Present Illness:    Micheal Potts is a 61 y.o. male with a hx of prediabetes, hypertension, hyperlipidemia is here today to establish cardiac care.  The patient tells me that he had a coronary calcium score done by his PCP for prevention stratification and after this he was asked to see cardiology.  He denies any symptoms.  He notes that prior to the test he had no symptoms either.  He adamantly denies any chest pain, shortness of breath, lightheadedness or dizziness.   Past Medical History:  Diagnosis Date   Allergy    GERD (gastroesophageal reflux disease)    Heart murmur    AS CHILD ONLY - OUT GREW   Hyperlipidemia    Hypertension     Past Surgical History:  Procedure Laterality Date   COLONOSCOPY  2011    Current Medications: Current Meds  Medication Sig   atorvastatin (LIPITOR) 40 MG tablet Take 1 tablet by mouth daily.   Cetirizine HCl 10 MG CAPS Take by mouth.   ezetimibe (ZETIA) 10 MG tablet Take 10 mg by mouth daily.   fluticasone (FLONASE) 50 MCG/ACT nasal spray Place into the nose.   guaiFENesin (MUCINEX) 600 MG 12 hr tablet Take 1 tablet (600 mg total) by mouth 2 (two) times daily as needed for cough or to loosen phlegm.   lisinopril (ZESTRIL) 10 MG tablet Take 1 tablet by mouth daily.   OZEMPIC, 1 MG/DOSE, 4 MG/3ML SOPN Inject 1 mg into the skin once a week.     Allergies:   Patient has no known allergies.   Social History   Socioeconomic History   Marital status: Married    Spouse name: Not on file   Number of children: Not on file   Years of education: Not on file   Highest education level: Not on file  Occupational History   Not on file  Tobacco Use   Smoking status: Never   Smokeless tobacco: Current     Types: Chew   Tobacco comments:    SELDOM CHEW USE   Substance and Sexual Activity   Alcohol use: No   Drug use: No   Sexual activity: Not on file  Other Topics Concern   Not on file  Social History Narrative   Not on file   Social Determinants of Health   Financial Resource Strain: Not on file  Food Insecurity: Not on file  Transportation Needs: Not on file  Physical Activity: Not on file  Stress: Not on file  Social Connections: Not on file     Family History: The patient's family history is not on file.  ROS:   Review of Systems  Constitution: Negative for decreased appetite, fever and weight gain.  HENT: Negative for congestion, ear discharge, hoarse voice and sore throat.   Eyes: Negative for discharge, redness, vision loss in right eye and visual halos.  Cardiovascular: Negative for chest pain, dyspnea on exertion, leg swelling, orthopnea and palpitations.  Respiratory: Negative for cough, hemoptysis, shortness of breath and snoring.   Endocrine: Negative for heat intolerance and polyphagia.  Hematologic/Lymphatic: Negative for bleeding problem. Does not bruise/bleed easily.  Skin: Negative for flushing, nail changes, rash and suspicious lesions.  Musculoskeletal: Negative for arthritis, joint pain, muscle cramps, myalgias, neck pain and stiffness.  Gastrointestinal: Negative for abdominal pain, bowel incontinence, diarrhea and excessive appetite.  Genitourinary: Negative for decreased libido, genital sores and incomplete emptying.  Neurological: Negative for brief paralysis, focal weakness, headaches and loss of balance.  Psychiatric/Behavioral: Negative for altered mental status, depression and suicidal ideas.  Allergic/Immunologic: Negative for HIV exposure and persistent infections.    EKGs/Labs/Other Studies Reviewed:    The following studies were reviewed today:   EKG: Sinus rhythm, heart rate 70 bpm  Coronary calcium score September 13, 2020 EXAM: CT  CARDIAC CORONARY ARTERY CALCIUM SCORE   TECHNIQUE: Non-contrast imaging through the heart was performed using prospective ECG gating. Image post processing was performed on an independent workstation, allowing for quantitative analysis of the heart and coronary arteries. Note that this exam targets the heart and the chest was not imaged in its entirety.   COMPARISON:  None.   FINDINGS: CORONARY CALCIUM SCORES:   Left Main: 0   LAD: 231   LCx: 221   RCA: 196   Total Agatston Score: 647   MESA database percentile: 97   AORTA MEASUREMENTS:   Ascending Aorta: 32 mm   Descending Aorta: 24 mm   OTHER FINDINGS:   Heart size is normal. No significant pericardial fluid. Visualized mediastinal structures are unremarkable. Images of the upper abdomen are unremarkable. Small amount of calcium involving the descending thoracic aorta. Visualized lungs are clear. Linear density in the posterior left lung is suggestive for atelectasis or mild scarring. No large pleural effusions. No acute bone abnormality.   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 Coronary calcium score is 647 and this is at percentile 97 for patients of the same age, gender and ethnicity.   Aortic Atherosclerosis (ICD10-I70.0).     Electronically Signed   By: Richarda OverlieAdam  Henn M.D.   On: 09/13/2020 14:30  Recent Labs: No results found for requested labs within last 8760 hours.  Recent Lipid Panel No results found for: CHOL, TRIG, HDL, CHOLHDL, VLDL, LDLCALC, LDLDIRECT  Physical Exam:    VS:  BP 114/82    Pulse 70    Ht 5' 9.25" (1.759 m)    Wt 196 lb 6.4 oz (89.1 kg)    SpO2 98%    BMI 28.79 kg/m     Wt Readings from Last 3 Encounters:  02/24/21 196 lb 6.4 oz (89.1 kg)  08/27/20 198 lb (89.8 kg)  08/26/20 198 lb (89.8 kg)     GEN: Well nourished, well developed in no acute distress HEENT: Normal NECK: No JVD; No carotid bruits LYMPHATICS: No lymphadenopathy CARDIAC: S1S2 noted,RRR, no  murmurs, rubs, gallops RESPIRATORY:  Clear to auscultation without rales, wheezing or rhonchi  ABDOMEN: Soft, non-tender, non-distended, +bowel sounds, no guarding. EXTREMITIES: No edema, No cyanosis, no clubbing MUSCULOSKELETAL:  No deformity  SKIN: Warm and dry NEUROLOGIC:  Alert and oriented x 3, non-focal PSYCHIATRIC:  Normal affect, good insight  ASSESSMENT:    1. Coronary artery calcification seen on CT scan   2. Hypertension, unspecified type   3. Prediabetes   4. Mixed hyperlipidemia    PLAN:    We discussed his CTs finding which showed coronary calcium score.  He is on aspirin as well as atorvastatin 40 mg daily with Zetia 10 mg daily.  He adamantly denies any anginal symptoms.  I educated the patient what this means the fact that he has coronary artery disease by coronary calcium score.  I advised  him on warning signs.  He expresses understanding.  Blood pressure is acceptable, continue with current antihypertensive regimen.  Hyperlipidemia - continue with current statin medication.  He does have prediabetes he is aware this he tells me that he has been managing with his diet.  The patient is in agreement with the above plan. The patient left the office in stable condition.  The patient will follow up in   Medication Adjustments/Labs and Tests Ordered: Current medicines are reviewed at length with the patient today.  Concerns regarding medicines are outlined above.  Orders Placed This Encounter  Procedures   EKG 12-Lead   No orders of the defined types were placed in this encounter.   Patient Instructions  Medication Instructions:  Your physician recommends that you continue on your current medications as directed. Please refer to the Current Medication list given to you today.  *If you need a refill on your cardiac medications before your next appointment, please call your pharmacy*   Lab Work: None If you have labs (blood work) drawn today and your tests are  completely normal, you will receive your results only by: MyChart Message (if you have MyChart) OR A paper copy in the mail If you have any lab test that is abnormal or we need to change your treatment, we will call you to review the results.   Testing/Procedures: None   Follow-Up: At [facility], you and your health needs are our priority.  As part of our continuing mission to provide you with exceptional heart care, we have created designated Provider Care Teams.  These Care Teams include your primary Cardiologist (physician) and Advanced Practice Providers (APPs -  Physician Assistants and Nurse Practitioners) who all work together to provide you with the care you need, when you need it.  We recommend signing up for the patient portal called "MyChart".  Sign up information is provided on this After Visit Summary.  MyChart is used to connect with patients for Virtual Visits (Telemedicine).  Patients are able to view lab/test results, encounter notes, upcoming appointments, etc.  Non-urgent messages can be sent to your provider as well.   To learn more about what you can do with MyChart, go to [email or web address].    Your next appointment:   1 year(s)  The format for your next appointment:   In Person  Provider:   Thomasene Ripple, DO     Other Instructions     Adopting a Healthy Lifestyle.  Know what a healthy weight is for you (roughly BMI <25) and aim to maintain this   Aim for 7+ servings of fruits and vegetables daily   65-80+ fluid ounces of water or unsweet tea for healthy kidneys   Limit to max 1 drink of alcohol per day; avoid smoking/tobacco   Limit animal fats in diet for cholesterol and heart health - choose grass fed whenever available   Avoid highly processed foods, and foods high in saturated/trans fats   Aim for low stress - take time to unwind and care for your mental health   Aim for 150 min of moderate intensity exercise weekly for heart health, and  weights twice weekly for bone health   Aim for 7-9 hours of sleep daily   When it comes to diets, agreement about the perfect plan isnt easy to find, even among the experts. Experts at the [facility] [employer] developed an idea known as the Healthy Eating Plate. Just imagine a plate divided into logical, healthy  portions.   The emphasis is on diet quality:   Load up on vegetables and fruits - one-half of your plate: Aim for color and variety, and remember that potatoes dont count.   Go for whole grains - one-quarter of your plate: Whole wheat, barley, wheat berries, quinoa, oats, brown rice, and foods made with them. If you want pasta, go with whole wheat pasta.   Protein power - one-quarter of your plate: Fish, chicken, beans, and nuts are all healthy, versatile protein sources. Limit red meat.   The diet, however, does go beyond the plate, offering a few other suggestions.   Use healthy plant oils, such as olive, canola, soy, corn, sunflower and peanut. Check the labels, and avoid partially hydrogenated oil, which have unhealthy trans fats.   If youre thirsty, drink water. Coffee and tea are good in moderation, but skip sugary drinks and limit milk and dairy products to one or two daily servings.   The type of carbohydrate in the diet is more important than the amount. Some sources of carbohydrates, such as vegetables, fruits, whole grains, and beans-are healthier than others.   Finally, stay active  Signed, Thomasene Ripple, DO  02/24/2021 10:05 AM     [facility]

## 2021-04-24 DIAGNOSIS — H6692 Otitis media, unspecified, left ear: Secondary | ICD-10-CM | POA: Diagnosis not present

## 2021-05-20 DIAGNOSIS — N3001 Acute cystitis with hematuria: Secondary | ICD-10-CM | POA: Diagnosis not present

## 2021-06-20 DIAGNOSIS — I2510 Atherosclerotic heart disease of native coronary artery without angina pectoris: Secondary | ICD-10-CM | POA: Diagnosis not present

## 2021-06-20 DIAGNOSIS — Z0000 Encounter for general adult medical examination without abnormal findings: Secondary | ICD-10-CM | POA: Diagnosis not present

## 2021-06-20 DIAGNOSIS — R972 Elevated prostate specific antigen [PSA]: Secondary | ICD-10-CM | POA: Diagnosis not present

## 2021-06-20 DIAGNOSIS — J309 Allergic rhinitis, unspecified: Secondary | ICD-10-CM | POA: Diagnosis not present

## 2021-06-20 DIAGNOSIS — I10 Essential (primary) hypertension: Secondary | ICD-10-CM | POA: Diagnosis not present

## 2021-08-19 ENCOUNTER — Encounter: Payer: Self-pay | Admitting: Urology

## 2021-08-19 ENCOUNTER — Ambulatory Visit (INDEPENDENT_AMBULATORY_CARE_PROVIDER_SITE_OTHER): Payer: Federal, State, Local not specified - PPO | Admitting: Urology

## 2021-08-19 VITALS — BP 146/91 | HR 66

## 2021-08-19 DIAGNOSIS — R972 Elevated prostate specific antigen [PSA]: Secondary | ICD-10-CM

## 2021-08-19 DIAGNOSIS — R8290 Unspecified abnormal findings in urine: Secondary | ICD-10-CM | POA: Diagnosis not present

## 2021-08-19 DIAGNOSIS — N390 Urinary tract infection, site not specified: Secondary | ICD-10-CM

## 2021-08-19 LAB — MICROSCOPIC EXAMINATION: Renal Epithel, UA: NONE SEEN /hpf

## 2021-08-19 LAB — URINALYSIS, ROUTINE W REFLEX MICROSCOPIC
Bilirubin, UA: NEGATIVE
Glucose, UA: NEGATIVE
Ketones, UA: NEGATIVE
Nitrite, UA: POSITIVE — AB
RBC, UA: NEGATIVE
Specific Gravity, UA: 1.015 (ref 1.005–1.030)
Urobilinogen, Ur: 2 mg/dL — ABNORMAL HIGH (ref 0.2–1.0)
pH, UA: 8.5 — ABNORMAL HIGH (ref 5.0–7.5)

## 2021-08-19 MED ORDER — SULFAMETHOXAZOLE-TRIMETHOPRIM 800-160 MG PO TABS: 1.0000 | ORAL_TABLET | Freq: Two times a day (BID) | ORAL | 0 refills | Status: DC

## 2021-08-19 NOTE — Progress Notes (Signed)
Assessment: 1. Elevated PSA   2. Urinary tract infection without hematuria, site unspecified   3. Abnormal urine findings     Plan: I reviewed the patient's records from Dr. Carolee Rota office including office notes and lab results. Urine culture sent today Begin Bactrim DS x 10 days.  Consider extending tx for another 10 days for prostatitis. His elevated PSA is likely due to his ongoing UTI.  Will need to completely treat UTI and recheck PSA after 3 months. Return to office in 2 weeks  Chief Complaint:  Chief Complaint  Patient presents with   Elevated PSA    History of Present Illness:  Micheal Potts is a 61 y.o. year old male who is seen in consultation from Merri Brunette, MD for evaluation of elevated PSA. He was diagnosed with a UTI at urgent care in April 2023.  He was having symptoms of dysuria at that time.  He was treated with an antibiotic for 1 week.  No lab results available.  His dysuria improved but did not completely resolve.  Urinalysis from 06/20/2021 showed 5-10 WBCs, rare bacteria.   PSA results: 3/22 1.99 4/22 4.4 5/23 7.16 6/23 9.2  He has a prior history of an elevated PSA and underwent a prostate biopsy approximately 20 years ago.  Biopsy was negative by report.  He continues to have some occasional dysuria.  He also reports occasional urgency.  No gross hematuria. IPSS = 6 today.  Past Medical History:  Past Medical History:  Diagnosis Date   Allergy    GERD (gastroesophageal reflux disease)    Heart murmur    AS CHILD ONLY - OUT GREW   Hyperlipidemia    Hypertension     Past Surgical History:  Past Surgical History:  Procedure Laterality Date   COLONOSCOPY  2011    Allergies:  No Known Allergies  Family History:  No family history on file.  Social History:  Social History   Tobacco Use   Smoking status: Never   Smokeless tobacco: Current    Types: Chew   Tobacco comments:    SELDOM CHEW USE   Substance Use Topics   Alcohol use:  No   Drug use: No    Review of symptoms:  Constitutional:  Negative for unexplained weight loss, night sweats, fever, chills ENT:  Negative for nose bleeds, sinus pain, painful swallowing CV:  Negative for chest pain, shortness of breath, exercise intolerance, palpitations, loss of consciousness Resp:  Negative for cough, wheezing, shortness of breath GI:  Negative for nausea, vomiting, diarrhea, bloody stools GU:  Positives noted in HPI; otherwise negative for gross hematuria, urinary incontinence Neuro:  Negative for seizures, poor balance, limb weakness, slurred speech Psych:  Negative for lack of energy, depression, anxiety Endocrine:  Negative for polydipsia, polyuria, symptoms of hypoglycemia (dizziness, hunger, sweating) Hematologic:  Negative for anemia, purpura, petechia, prolonged or excessive bleeding, use of anticoagulants  Allergic:  Negative for difficulty breathing or choking as a result of exposure to anything; no shellfish allergy; no allergic response (rash/itch) to materials, foods  Physical exam: BP (!) 146/91   Pulse 66  GENERAL APPEARANCE:  Well appearing, well developed, well nourished, NAD HEENT: Atraumatic, Normocephalic, oropharynx clear. NECK: Supple without lymphadenopathy or thyromegaly. LUNGS: Clear to auscultation bilaterally. HEART: Regular Rate and Rhythm without murmurs, gallops, or rubs. ABDOMEN: Soft, non-tender, No Masses. EXTREMITIES: Moves all extremities well.  Without clubbing, cyanosis, or edema. NEUROLOGIC:  Alert and oriented x 3, normal gait, CN II-XII grossly  intact.  MENTAL STATUS:  Appropriate. BACK:  Non-tender to palpation.  No CVAT SKIN:  Warm, dry and intact.   GU: Penis:  circumcised Meatus: Normal Scrotum: normal, no masses Testis: normal without masses bilateral Epididymis: normal Prostate: 50 g, some tenderness to palpation, no nodules Rectum: Normal tone,  no masses or tenderness   Results: U/A: 11-30 WBCs, 0-2 RBCs,  many bacteria, nitrite positive

## 2021-08-23 ENCOUNTER — Other Ambulatory Visit: Payer: Self-pay

## 2021-08-23 ENCOUNTER — Emergency Department (HOSPITAL_BASED_OUTPATIENT_CLINIC_OR_DEPARTMENT_OTHER)
Admission: EM | Admit: 2021-08-23 | Discharge: 2021-08-23 | Disposition: A | Discharge status: Home / Self Care | Payer: Federal, State, Local not specified - PPO | Attending: Emergency Medicine | Admitting: Emergency Medicine

## 2021-08-23 DIAGNOSIS — K0889 Other specified disorders of teeth and supporting structures: Secondary | ICD-10-CM | POA: Diagnosis not present

## 2021-08-23 DIAGNOSIS — R519 Headache, unspecified: Secondary | ICD-10-CM | POA: Diagnosis not present

## 2021-08-23 DIAGNOSIS — I10 Essential (primary) hypertension: Secondary | ICD-10-CM | POA: Diagnosis not present

## 2021-08-23 LAB — COMPREHENSIVE METABOLIC PANEL
ALT: 23 U/L (ref 0–44)
AST: 19 U/L (ref 15–41)
Albumin: 4.6 g/dL (ref 3.5–5.0)
Alkaline Phosphatase: 56 U/L (ref 38–126)
Anion gap: 12 (ref 5–15)
BUN: 7 mg/dL — ABNORMAL LOW (ref 8–23)
CO2: 24 mmol/L (ref 22–32)
Calcium: 9.5 mg/dL (ref 8.9–10.3)
Chloride: 104 mmol/L (ref 98–111)
Creatinine, Ser: 1.31 mg/dL — ABNORMAL HIGH (ref 0.61–1.24)
GFR, Estimated: >60 mL/min (ref >60)
Glucose, Bld: 87 mg/dL (ref 70–99)
Potassium: 3.8 mmol/L (ref 3.5–5.1)
Sodium: 140 mmol/L (ref 135–145)
Total Bilirubin: 0.6 mg/dL (ref 0.3–1.2)
Total Protein: 7.1 g/dL (ref 6.5–8.1)

## 2021-08-23 LAB — CBC WITH DIFFERENTIAL/PLATELET
Abs Immature Granulocytes: 0.02 10*3/uL (ref 0.00–0.07)
Basophils Absolute: 0 10*3/uL (ref 0.0–0.1)
Basophils Relative: 0 %
Eosinophils Absolute: 0.1 10*3/uL (ref 0.0–0.5)
Eosinophils Relative: 2 %
HCT: 44.2 % (ref 39.0–52.0)
Hemoglobin: 14.5 g/dL (ref 13.0–17.0)
Immature Granulocytes: 0 %
Lymphocytes Relative: 23 %
Lymphs Abs: 1.7 10*3/uL (ref 0.7–4.0)
MCH: 28.4 pg (ref 26.0–34.0)
MCHC: 32.8 g/dL (ref 30.0–36.0)
MCV: 86.5 fL (ref 80.0–100.0)
Monocytes Absolute: 0.6 10*3/uL (ref 0.1–1.0)
Monocytes Relative: 8 %
Neutro Abs: 5 10*3/uL (ref 1.7–7.7)
Neutrophils Relative %: 67 %
Platelets: 203 10*3/uL (ref 150–400)
RBC: 5.11 MIL/uL (ref 4.22–5.81)
RDW: 13 % (ref 11.5–15.5)
WBC: 7.5 10*3/uL (ref 4.0–10.5)
nRBC: 0 % (ref 0.0–0.2)

## 2021-08-23 LAB — URINE CULTURE

## 2021-08-23 MED ORDER — PENICILLIN V POTASSIUM 500 MG PO TABS: 500.0000 mg | ORAL_TABLET | Freq: Four times a day (QID) | ORAL | 0 refills | Status: AC

## 2021-08-23 NOTE — ED Triage Notes (Signed)
Patient arrives with complaints of left-sided facial pain and ear pain x1 day. Patient report sinus issues.

## 2021-08-23 NOTE — Discharge Instructions (Addendum)
Take antibiotic as directed every 6 hours until you follow-up with a dentist.  Please call the dentist early in the morning tomorrow to set up an appointment.  You can continue to take Tylenol/ibuprofen as needed for pain.  Please avoid medicines with "D" as they will raise your blood pressure.  You can take regular Zyrtec for allergies in the future.  Please not hesitate to return to emergency department for worrisome signs and symptoms we discussed become apparent.

## 2021-08-23 NOTE — ED Provider Notes (Signed)
[facility] EMERGENCY DEPT Provider Note   CSN: [id number] Arrival date & time: 08/23/21  1206     History  Chief Complaint  Patient presents with   Facial Pain   Otalgia    Left    Micheal Potts is a 61 y.o. male.   Otalgia Associated symptoms: no abdominal pain, no cough, no fever, no rash, no sore throat and no vomiting    61 year old male presents emergency department with complaints of left-sided facial pain.  Patient states that symptoms have been present since this past Wednesday.  He notes allergies in the past that have presented similarly initially.  He attempted to take Flonase and Zyrtec D and Mucinex D with minimal to no relief.  He also notes a cracked lower left molar that has been present for the past 2 weeks but has not given him much trouble.  Facial pain is overlying the area of affected molar.  Denies trismus, difficulty consuming liquids/solids, difficulty breathing/throat closing in feeling, external swelling.  Denies fever, chills, night sweats, chest pain, shortness of breath, Donnell pain, nausea/vomiting/diarrhea, urinary symptoms, change in bowel habits.  Home Medications Prior to Admission medications   Medication Sig Start Date End Date Taking? Authorizing Provider  atorvastatin (LIPITOR) 40 MG tablet Take 1 tablet by mouth daily. 06/09/20  Yes [provider]  Cetirizine HCl 10 MG CAPS Take by mouth.   Yes [provider]  ezetimibe (ZETIA) 10 MG tablet Take 10 mg by mouth daily. 05/18/20  Yes [provider]  fluticasone (FLONASE) 50 MCG/ACT nasal spray Place into the nose.   Yes [provider]  guaiFENesin (MUCINEX) 600 MG 12 hr tablet Take 1 tablet (600 mg total) by mouth 2 (two) times daily as needed for cough or to loosen phlegm. 01/27/13  Yes Fayrene Helper, PA-C  lisinopril (ZESTRIL) 10 MG tablet Take 1 tablet by mouth daily. 07/29/20  Yes [provider]  OZEMPIC, 1 MG/DOSE, 4 MG/3ML SOPN Inject 1  mg into the skin once a week. 07/21/20  Yes [provider]  penicillin v potassium (VEETID) 500 MG tablet Take 1 tablet (500 mg total) by mouth 4 (four) times daily for 7 days. 08/23/21 08/30/21 Yes Sherian Maroon A, PA  sulfamethoxazole-trimethoprim (BACTRIM DS) 800-160 MG tablet Take 1 tablet by mouth every 12 (twelve) hours for 10 days. 08/19/21 08/29/21 Yes Stoneking, Danford Bad., MD      Allergies    Patient has no known allergies.    Review of Systems   Review of Systems  Constitutional:  Negative for chills and fever.  HENT:  Positive for dental problem and facial swelling. Negative for ear pain and sore throat.   Eyes:  Negative for pain, discharge, itching and visual disturbance.  Respiratory:  Negative for cough, choking and shortness of breath.   Cardiovascular:  Negative for chest pain and palpitations.  Gastrointestinal:  Negative for abdominal pain and vomiting.  Genitourinary:  Negative for dysuria and hematuria.  Musculoskeletal:  Negative for arthralgias and back pain.  Skin:  Negative for color change and rash.  Neurological:  Negative for seizures and syncope.  All other systems reviewed and are negative.   Physical Exam Updated Vital Signs BP (!) 164/99 (BP Location: Right Arm)   Pulse 68   Temp 98 F (36.7 C)   Resp 14   Ht 5\' 10"  (1.778 m)   Wt 85.3 kg   SpO2 99%   BMI 26.98 kg/m  Physical Exam Vitals and nursing  note reviewed.  Constitutional:      General: He is not in acute distress.    Appearance: Normal appearance. He is well-developed. He is not ill-appearing.  HENT:     Head: Normocephalic and atraumatic.     Jaw: There is normal jaw occlusion. No trismus, pain on movement or malocclusion.     Salivary Glands: Right salivary gland is not diffusely enlarged or tender. Left salivary gland is not diffusely enlarged or tender.     Comments: No submandibular swelling noted.  No tenderness to palpation in the submandibular region.  Tenderness to  palpation along.  No edema or tenderness noted beneath the tongue.  Posterior pharynx is not erythematic and no exudate noted.  Uvula is midline and rises symmetrically with phonation.  No external swelling noted.    Right Ear: Tympanic membrane, ear canal and external ear normal.     Left Ear: Tympanic membrane, ear canal and external ear normal.     Ears:     Comments: No rash noted on internal or external ear.    Nose: Nose normal.     Mouth/Throat:     Lips: Pink. No lesions.     Mouth: Mucous membranes are moist.     Dentition: Dental tenderness present. No gingival swelling or gum lesions.     Tongue: No lesions. Tongue does not deviate from midline.     Palate: No mass.     Pharynx: Oropharynx is clear. No oropharyngeal exudate or posterior oropharyngeal erythema.     Tonsils: No tonsillar exudate or tonsillar abscesses.      Comments: Dental tenderness of affected chipped molar above.  No obvious visual signs of periapical abscess noted.  Eyes:     General:        Right eye: No discharge.        Left eye: No discharge.     Extraocular Movements: Extraocular movements intact.     Conjunctiva/sclera: Conjunctivae normal.     Pupils: Pupils are equal, round, and reactive to light.  Cardiovascular:     Rate and Rhythm: Normal rate and regular rhythm.     Heart sounds: No murmur heard. Pulmonary:     Effort: Pulmonary effort is normal. No respiratory distress.     Breath sounds: Normal breath sounds.  Abdominal:     Palpations: Abdomen is soft.     Tenderness: There is no abdominal tenderness. There is no guarding.  Musculoskeletal:        General: No swelling.     Cervical back: Neck supple. No rigidity or tenderness.     Right lower leg: No edema.     Left lower leg: No edema.  Skin:    General: Skin is warm and dry.     Capillary Refill: Capillary refill takes less than 2 seconds.  Neurological:     Mental Status: He is alert.  Psychiatric:        Mood and Affect:  Mood normal.     ED Results / Procedures / Treatments   Labs (all labs ordered are listed, but only abnormal results are displayed) Labs Reviewed  COMPREHENSIVE METABOLIC PANEL - Abnormal; Notable for the following components:      Result Value   BUN 7 (*)    Creatinine, Ser 1.31 (*)    All other components within normal limits  CBC WITH DIFFERENTIAL/PLATELET    EKG None  Radiology No results found.  Procedures Procedures    Medications Ordered in ED  Medications - No data to display  ED Course/ Medical Decision Making/ A&P                           Medical Decision Making Amount and/or Complexity of Data Reviewed Labs: ordered.   This patient presents to the ED for concern of dental pain, this involves an extensive number of treatment options, and is a complaint that carries with it a high risk of complications and morbidity.  The differential diagnosis includes Ludwig angina, anaphylaxis, periapical abscess, peritonsillar abscess, pharyngitis, gingivitis malignancy   Co morbidities that complicate the patient evaluation  Allergy, GERD, hyperlipidemia, hypertension   Lab Tests:  I Ordered, and personally interpreted labs.  The pertinent results include: No acute abnormality   Imaging Studies ordered:  N/a   Cardiac Monitoring: / EKG:  The patient was maintained on a cardiac monitor.  I personally viewed and interpreted the cardiac monitored which showed an underlying rhythm of: Sinus rhythm   Consultations Obtained:  N/a   Problem List / ED Course / Critical interventions / Medication management  Dental pain Reevaluation of the patient showed that the patient improved I have reviewed the patients home medicines and have made adjustments as needed   Social Determinants of Health:  Currently chews tobacco.  Denies illicit drug use.   Test / Admission - Considered:  Dental pain Vitals signs significant for hypertension with blood pressure  164/99.  Likely related to patient's consumption of 2 decongestion medicines.  Patient aware of decongestions ability to raise blood pressure. Otherwise within normal range and stable throughout visit. Laboratory studies significant for: No acute abnormalities.  Patient symptoms most related to cracked molar indicated above.  No concern at this time for Ludwig angina given lack of patient's symptoms and reassuring physical exam.  Doubt peritonsillar abscess.  Doubt anaphylaxis.  Patient to be prescribed antibiotics outpatient.  Patient to keep his appointment with his dentist tomorrow for further assessment. Worrisome signs and symptoms were discussed with the patient, and the patient acknowledged understanding to return to the ED if noticed. Patient was stable upon discharge.          Final Clinical Impression(s) / ED Diagnoses Final diagnoses:  Pain, dental    Rx / DC Orders ED Discharge Orders          Ordered    penicillin v potassium (VEETID) 500 MG tablet  4 times daily        08/23/21 1540              Peter Garter, [place] 08/23/21 1541    Margarita Grizzle, MD 08/24/21 0700

## 2021-08-24 ENCOUNTER — Telehealth: Payer: Self-pay

## 2021-08-24 MED ORDER — SULFAMETHOXAZOLE-TRIMETHOPRIM 800-160 MG PO TABS: 1.0000 | ORAL_TABLET | Freq: Two times a day (BID) | ORAL | 0 refills | Status: AC

## 2021-08-24 NOTE — Telephone Encounter (Signed)
-----   Message from Milderd Meager, MD sent at 08/24/2021  8:49 AM EDT ----- Please notify patient to continue Bactrim for a total of 20 days for treatment of his UTI and prostatitis.  A refill has been sent to his pharmacy. Keep f/u for 7/14 as schedule.

## 2021-08-24 NOTE — Addendum Note (Signed)
Addended by: Milderd Meager on: 08/24/2021 08:49 AM   Modules accepted: Orders

## 2021-08-24 NOTE — Telephone Encounter (Signed)
Patient called and made aware.

## 2021-09-02 ENCOUNTER — Ambulatory Visit: Payer: Federal, State, Local not specified - PPO | Admitting: Urology

## 2021-09-02 ENCOUNTER — Encounter: Payer: Self-pay | Admitting: Urology

## 2021-09-02 VITALS — BP 139/82 | HR 83 | Ht 69.5 in | Wt 188.0 lb

## 2021-09-02 DIAGNOSIS — R972 Elevated prostate specific antigen [PSA]: Secondary | ICD-10-CM

## 2021-09-02 DIAGNOSIS — N390 Urinary tract infection, site not specified: Secondary | ICD-10-CM

## 2021-09-02 LAB — URINALYSIS, ROUTINE W REFLEX MICROSCOPIC
Bilirubin, UA: NEGATIVE
Glucose, UA: NEGATIVE
Ketones, UA: NEGATIVE
Leukocytes,UA: NEGATIVE
Nitrite, UA: NEGATIVE
Protein,UA: NEGATIVE
RBC, UA: NEGATIVE
Specific Gravity, UA: 1.015 (ref 1.005–1.030)
Urobilinogen, Ur: 0.2 mg/dL (ref 0.2–1.0)
pH, UA: 5 (ref 5.0–7.5)

## 2021-09-02 NOTE — Progress Notes (Signed)
   Assessment: 1. Elevated PSA   2. Urinary tract infection without hematuria, site unspecified     Plan: Complete 20 day course of Bactrim DS  His elevated PSA is likely due to his UTI.  Will need to completely treat UTI and recheck PSA after 3 months. Return to office in 1 month  Chief Complaint:  Chief Complaint  Patient presents with   Urinary Incontinence    History of Present Illness:  Micheal Potts is a 61 y.o. year old male who is seen for further evaluation of elevated PSA and UTI. He was diagnosed with a UTI at urgent care in April 2023.  He was having symptoms of dysuria at that time.  He was treated with an antibiotic for 1 week.  No lab results available.  His dysuria improved but did not completely resolve.  Urinalysis from 06/20/2021 showed 5-10 WBCs, rare bacteria.   PSA results: 3/22 1.99 4/22 4.4 5/23 7.16 6/23 9.2  He has a prior history of an elevated PSA and underwent a prostate biopsy approximately 20 years ago.  Biopsy was negative by report.  At his initial visit in June 2023, he continued to have some occasional dysuria and occasional urgency.  No gross hematuria. IPSS = 6. Prostate exam demonstrated some tenderness. Urinalysis was nitrite positive.  Urine culture grew >100 K E. coli.  He was treated with Bactrim DS for 20 days.  He returns today for follow-up.  He continues on his Bactrim DS.  He has noted improvement in his urinary symptoms.  He is not having any dysuria or urgency at the present time. IPSS = 0 today.  Portions of the above documentation were copied from a prior visit for review purposes only.   Past Medical History:  Past Medical History:  Diagnosis Date   Allergy    GERD (gastroesophageal reflux disease)    Heart murmur    AS CHILD ONLY - OUT GREW   Hyperlipidemia    Hypertension     Past Surgical History:  Past Surgical History:  Procedure Laterality Date   COLONOSCOPY  2011    Allergies:  No Known  Allergies  Family History:  No family history on file.  Social History:  Social History   Tobacco Use   Smoking status: Never   Smokeless tobacco: Current    Types: Chew   Tobacco comments:    SELDOM CHEW USE   Substance Use Topics   Alcohol use: No   Drug use: No    ROS: Constitutional:  Negative for fever, chills, weight loss CV: Negative for chest pain, previous MI, hypertension Respiratory:  Negative for shortness of breath, wheezing, sleep apnea, frequent cough GI:  Negative for nausea, vomiting, bloody stool, GERD  Physical exam: BP 139/82   Pulse 83   Ht 5' 9.5" (1.765 m)   Wt 188 lb (85.3 kg)   BMI 27.36 kg/m  GENERAL APPEARANCE:  Well appearing, well developed, well nourished, NAD HEENT:  Atraumatic, normocephalic, oropharynx clear NECK:  Supple without lymphadenopathy or thyromegaly ABDOMEN:  Soft, non-tender, no masses EXTREMITIES:  Moves all extremities well, without clubbing, cyanosis, or edema NEUROLOGIC:  Alert and oriented x 3, normal gait, CN II-XII grossly intact MENTAL STATUS:  appropriate BACK:  Non-tender to palpation, No CVAT SKIN:  Warm, dry, and intact   Results: U/A: dipstick negative

## 2021-10-07 ENCOUNTER — Ambulatory Visit: Payer: Federal, State, Local not specified - PPO | Admitting: Urology

## 2021-10-07 NOTE — Progress Notes (Deleted)
   Assessment: 1. Elevated PSA   2. History of UTI     Plan: His elevated PSA is likely due to his UTI.   PSA in 8 weeks - will call with results  Chief Complaint:  No chief complaint on file.   History of Present Illness:  Micheal Potts is a 61 y.o. year old male who is seen for further evaluation of elevated PSA and UTI. He was diagnosed with a UTI at urgent care in April 2023.  He was having symptoms of dysuria at that time.  He was treated with an antibiotic for 1 week.  No lab results available.  His dysuria improved but did not completely resolve.  Urinalysis from 06/20/2021 showed 5-10 WBCs, rare bacteria.   PSA results: 3/22 1.99 4/22 4.4 5/23 7.16 6/23 9.2  He has a prior history of an elevated PSA and underwent a prostate biopsy approximately 20 years ago.  Biopsy was negative by report.  At his initial visit in June 2023, he continued to have some occasional dysuria and occasional urgency.  No gross hematuria. IPSS = 6. Prostate exam demonstrated some tenderness. Urinalysis was nitrite positive.  Urine culture grew >100 K E. coli.  He was treated with Bactrim DS for 20 days. At his visit in July 2023, he continued on his Bactrim DS.  He noted improvement in his urinary symptoms.  No dysuria or gross hematuria.  IPSS = 0.  Portions of the above documentation were copied from a prior visit for review purposes only.   Past Medical History:  Past Medical History:  Diagnosis Date   Allergy    GERD (gastroesophageal reflux disease)    Heart murmur    AS CHILD ONLY - OUT GREW   Hyperlipidemia    Hypertension     Past Surgical History:  Past Surgical History:  Procedure Laterality Date   COLONOSCOPY  2011    Allergies:  No Known Allergies  Family History:  No family history on file.  Social History:  Social History   Tobacco Use   Smoking status: Never   Smokeless tobacco: Current    Types: Chew   Tobacco comments:    SELDOM CHEW USE   Substance Use  Topics   Alcohol use: No   Drug use: No    ROS: Constitutional:  Negative for fever, chills, weight loss CV: Negative for chest pain, previous MI, hypertension Respiratory:  Negative for shortness of breath, wheezing, sleep apnea, frequent cough GI:  Negative for nausea, vomiting, bloody stool, GERD  Physical exam: There were no vitals taken for this visit. GENERAL APPEARANCE:  Well appearing, well developed, well nourished, NAD HEENT:  Atraumatic, normocephalic, oropharynx clear NECK:  Supple without lymphadenopathy or thyromegaly ABDOMEN:  Soft, non-tender, no masses EXTREMITIES:  Moves all extremities well, without clubbing, cyanosis, or edema NEUROLOGIC:  Alert and oriented x 3, normal gait, CN II-XII grossly intact MENTAL STATUS:  appropriate BACK:  Non-tender to palpation, No CVAT SKIN:  Warm, dry, and intact   Results: U/A:

## 2021-10-19 ENCOUNTER — Ambulatory Visit: Payer: Federal, State, Local not specified - PPO | Admitting: Urology

## 2021-10-19 ENCOUNTER — Encounter: Payer: Self-pay | Admitting: Urology

## 2021-10-19 VITALS — BP 175/106 | HR 60 | Wt 183.0 lb

## 2021-10-19 DIAGNOSIS — Z87440 Personal history of urinary (tract) infections: Secondary | ICD-10-CM

## 2021-10-19 DIAGNOSIS — R972 Elevated prostate specific antigen [PSA]: Secondary | ICD-10-CM | POA: Diagnosis not present

## 2021-10-19 LAB — URINALYSIS, ROUTINE W REFLEX MICROSCOPIC
Bilirubin, UA: NEGATIVE
Glucose, UA: NEGATIVE
Ketones, UA: NEGATIVE
Leukocytes,UA: NEGATIVE
Nitrite, UA: NEGATIVE
Protein,UA: NEGATIVE
RBC, UA: NEGATIVE
Specific Gravity, UA: 1.02 (ref 1.005–1.030)
Urobilinogen, Ur: 1 mg/dL (ref 0.2–1.0)
pH, UA: 7.5 (ref 5.0–7.5)

## 2021-10-19 NOTE — Progress Notes (Signed)
   Assessment: 1. Elevated PSA   2. History of UTI     Plan: PSA today Will call with results Return to office in 3 months  Chief Complaint:  Chief Complaint  Patient presents with   Elevated PSA    History of Present Illness:  Micheal Potts is a 61 y.o. year old male who is seen for further evaluation of elevated PSA and UTI. He was diagnosed with a UTI at urgent care in April 2023.  He was having symptoms of dysuria at that time.  He was treated with an antibiotic for 1 week.  No lab results available.  His dysuria improved but did not completely resolve.  Urinalysis from 06/20/2021 showed 5-10 WBCs, rare bacteria.   PSA results: 3/22 1.99 4/22 4.4 5/23 7.16 6/23 9.2  He has a prior history of an elevated PSA and underwent a prostate biopsy approximately 20 years ago.  Biopsy was negative by report.  At his initial visit in June 2023, he continued to have some occasional dysuria and occasional urgency.  No gross hematuria. IPSS = 6. Prostate exam demonstrated some tenderness. Urinalysis was nitrite positive.  Urine culture grew >100 K E. coli.  He was treated with Bactrim DS for 20 days. At his visit in July 2023, he continued on his Bactrim DS.  He noted improvement in his urinary symptoms.  No dysuria or gross hematuria.  IPSS = 0.  He returns today for follow-up.  He has completed his antibiotics.  He is not having any lower urinary tract symptoms.  No dysuria or gross hematuria. IPSS = 1 today.  Portions of the above documentation were copied from a prior visit for review purposes only.   Past Medical History:  Past Medical History:  Diagnosis Date   Allergy    GERD (gastroesophageal reflux disease)    Heart murmur    AS CHILD ONLY - OUT GREW   Hyperlipidemia    Hypertension     Past Surgical History:  Past Surgical History:  Procedure Laterality Date   COLONOSCOPY  2011    Allergies:  No Known Allergies  Family History:  No family history on  file.  Social History:  Social History   Tobacco Use   Smoking status: Never   Smokeless tobacco: Current    Types: Chew   Tobacco comments:    SELDOM CHEW USE   Substance Use Topics   Alcohol use: No   Drug use: No    ROS: Constitutional:  Negative for fever, chills, weight loss CV: Negative for chest pain, previous MI, hypertension Respiratory:  Negative for shortness of breath, wheezing, sleep apnea, frequent cough GI:  Negative for nausea, vomiting, bloody stool, GERD  Physical exam: BP (!) 175/106   Pulse 60   Wt 183 lb (83 kg)   BMI 26.64 kg/m  GENERAL APPEARANCE:  Well appearing, well developed, well nourished, NAD HEENT:  Atraumatic, normocephalic, oropharynx clear NECK:  Supple without lymphadenopathy or thyromegaly ABDOMEN:  Soft, non-tender, no masses EXTREMITIES:  Moves all extremities well, without clubbing, cyanosis, or edema NEUROLOGIC:  Alert and oriented x 3, normal gait, CN II-XII grossly intact MENTAL STATUS:  appropriate BACK:  Non-tender to palpation, No CVAT SKIN:  Warm, dry, and intact   Results: U/A: Dipstick negative

## 2021-10-20 ENCOUNTER — Telehealth: Payer: Self-pay

## 2021-10-20 LAB — PSA: Prostate Specific Ag, Serum: 3.2 ng/mL (ref 0.0–4.0)

## 2021-10-20 NOTE — Telephone Encounter (Signed)
Patient aware and will f/u as scheduled.  

## 2021-10-20 NOTE — Telephone Encounter (Signed)
-----   Message from Milderd Meager, MD sent at 10/20/2021  8:54 AM EDT ----- Please inform patient that his PSA has returned to normal range.  Keep f/u for 3 months as scheduled.

## 2021-10-26 DIAGNOSIS — I10 Essential (primary) hypertension: Secondary | ICD-10-CM | POA: Diagnosis not present

## 2021-12-14 DIAGNOSIS — I10 Essential (primary) hypertension: Secondary | ICD-10-CM | POA: Diagnosis not present

## 2021-12-14 DIAGNOSIS — Z23 Encounter for immunization: Secondary | ICD-10-CM | POA: Diagnosis not present

## 2022-01-17 ENCOUNTER — Ambulatory Visit (INDEPENDENT_AMBULATORY_CARE_PROVIDER_SITE_OTHER): Payer: Federal, State, Local not specified - PPO | Admitting: Urology

## 2022-01-17 ENCOUNTER — Encounter: Payer: Self-pay | Admitting: Urology

## 2022-01-17 VITALS — BP 121/74 | HR 76 | Ht 69.0 in | Wt 188.0 lb

## 2022-01-17 DIAGNOSIS — Z87440 Personal history of urinary (tract) infections: Secondary | ICD-10-CM

## 2022-01-17 DIAGNOSIS — R972 Elevated prostate specific antigen [PSA]: Secondary | ICD-10-CM | POA: Diagnosis not present

## 2022-01-17 LAB — URINALYSIS, ROUTINE W REFLEX MICROSCOPIC
Bilirubin, UA: NEGATIVE
Glucose, UA: NEGATIVE
Ketones, UA: NEGATIVE
Leukocytes,UA: NEGATIVE
Nitrite, UA: NEGATIVE
Protein,UA: NEGATIVE
RBC, UA: NEGATIVE
Specific Gravity, UA: 1.015 (ref 1.005–1.030)
Urobilinogen, Ur: 2 mg/dL — ABNORMAL HIGH (ref 0.2–1.0)
pH, UA: 7 (ref 5.0–7.5)

## 2022-01-17 NOTE — Progress Notes (Signed)
Assessment: 1. Elevated PSA   2. History of UTI     Plan: PSA today Will call with results Return to office in 6 months  Chief Complaint:  Chief Complaint  Patient presents with   Elevated PSA    History of Present Illness:  Micheal Potts is a 61 y.o. year old male who is seen for further evaluation of elevated PSA and history of UTI. He was diagnosed with a UTI at urgent care in April 2023.  He was having symptoms of dysuria at that time.  He was treated with an antibiotic for 1 week.  No lab results available.  His dysuria improved but did not completely resolve.  Urinalysis from 06/20/2021 showed 5-10 WBCs, rare bacteria.   PSA results: 3/22 1.99 4/22 4.4 5/23 7.16 6/23 9.2  He has a prior history of an elevated PSA and underwent a prostate biopsy approximately 20 years ago.  Biopsy was negative by report.  At his initial visit in June 2023, he continued to have some occasional dysuria and occasional urgency.  No gross hematuria. IPSS = 6. Prostate exam demonstrated some tenderness. Urinalysis was nitrite positive.  Urine culture grew >100 K E. coli.  He was treated with Bactrim DS for 20 days. At his visit in July 2023, he continued on his Bactrim DS.  He noted improvement in his urinary symptoms.  No dysuria or gross hematuria.  IPSS = 0. At his visit in August 2023, he had completed his antibiotics.  He was not having any lower urinary tract symptoms.  No dysuria or gross hematuria. IPSS = 1. PSA 8/23:  3.2  He returns today for follow-up.  He is not having any lower urinary tract symptoms at the present time.  No dysuria or gross hematuria.  No UTIs since his last visit. IPSS = 1 today.  Portions of the above documentation were copied from a prior visit for review purposes only.   Past Medical History:  Past Medical History:  Diagnosis Date   Allergy    GERD (gastroesophageal reflux disease)    Heart murmur    AS CHILD ONLY - OUT GREW   Hyperlipidemia     Hypertension     Past Surgical History:  Past Surgical History:  Procedure Laterality Date   COLONOSCOPY  2011    Allergies:  No Known Allergies  Family History:  No family history on file.  Social History:  Social History   Tobacco Use   Smoking status: Never   Smokeless tobacco: Current    Types: Chew   Tobacco comments:    SELDOM CHEW USE   Substance Use Topics   Alcohol use: No   Drug use: No    ROS: Constitutional:  Negative for fever, chills, weight loss CV: Negative for chest pain, previous MI, hypertension Respiratory:  Negative for shortness of breath, wheezing, sleep apnea, frequent cough GI:  Negative for nausea, vomiting, bloody stool, GERD  Physical exam: BP 121/74   Pulse 76   Ht 5\' 9"  (1.753 m)   Wt 188 lb (85.3 kg)   BMI 27.76 kg/m  GENERAL APPEARANCE:  Well appearing, well developed, well nourished, NAD HEENT:  Atraumatic, normocephalic, oropharynx clear NECK:  Supple without lymphadenopathy or thyromegaly ABDOMEN:  Soft, non-tender, no masses EXTREMITIES:  Moves all extremities well, without clubbing, cyanosis, or edema NEUROLOGIC:  Alert and oriented x 3, normal gait, CN II-XII grossly intact MENTAL STATUS:  appropriate BACK:  Non-tender to palpation, No CVAT SKIN:  Warm, dry, and intact   Results: U/A: dipstick negative

## 2022-01-18 LAB — PSA: Prostate Specific Ag, Serum: 2.9 ng/mL (ref 0.0–4.0)

## 2022-01-19 ENCOUNTER — Telehealth: Payer: Self-pay

## 2022-01-19 NOTE — Telephone Encounter (Signed)
-----   Message from Milderd Meager, MD sent at 01/18/2022  1:25 PM EST ----- Please notify the patient that his PSA has decreased further and is now down to 2.9. Follow-up in 6 months as discussed.

## 2022-01-19 NOTE — Telephone Encounter (Signed)
Unable to get in touch with pt by phone, letter sent informing pt of MD response to results.

## 2022-06-20 DIAGNOSIS — R7303 Prediabetes: Secondary | ICD-10-CM | POA: Diagnosis not present

## 2022-06-20 DIAGNOSIS — Z0000 Encounter for general adult medical examination without abnormal findings: Secondary | ICD-10-CM | POA: Diagnosis not present

## 2022-06-23 DIAGNOSIS — Z0000 Encounter for general adult medical examination without abnormal findings: Secondary | ICD-10-CM | POA: Diagnosis not present

## 2022-06-23 DIAGNOSIS — I2510 Atherosclerotic heart disease of native coronary artery without angina pectoris: Secondary | ICD-10-CM | POA: Diagnosis not present

## 2022-06-23 DIAGNOSIS — I10 Essential (primary) hypertension: Secondary | ICD-10-CM | POA: Diagnosis not present

## 2022-06-23 DIAGNOSIS — J309 Allergic rhinitis, unspecified: Secondary | ICD-10-CM | POA: Diagnosis not present

## 2022-06-23 DIAGNOSIS — R972 Elevated prostate specific antigen [PSA]: Secondary | ICD-10-CM | POA: Diagnosis not present

## 2022-07-18 ENCOUNTER — Ambulatory Visit: Payer: Federal, State, Local not specified - PPO | Admitting: Urology

## 2022-07-18 NOTE — Progress Notes (Deleted)
   Assessment: 1. Elevated PSA   2. History of UTI     Plan: PSA today Will call with results Return to office in 6 months  Chief Complaint:  No chief complaint on file.   History of Present Illness:  Micheal Potts is a 62 y.o. year old male who is seen for further evaluation of elevated PSA and history of UTI. He was diagnosed with a UTI at urgent care in April 2023.  He was having symptoms of dysuria at that time.  He was treated with an antibiotic for 1 week.  No lab results available.  His dysuria improved but did not completely resolve.  Urinalysis from 06/20/2021 showed 5-10 WBCs, rare bacteria.   PSA results: 3/22 1.99 4/22 4.4 5/23 7.16 6/23 9.2  He has a prior history of an elevated PSA and underwent a prostate biopsy approximately 20 years ago.  Biopsy was negative by report.  At his initial visit in June 2023, he continued to have some occasional dysuria and occasional urgency.  No gross hematuria. IPSS = 6. Prostate exam demonstrated some tenderness. Urinalysis was nitrite positive.  Urine culture grew >100 K E. coli.  He was treated with Bactrim DS for 20 days. At his visit in July 2023, he continued on his Bactrim DS.  He noted improvement in his urinary symptoms.  No dysuria or gross hematuria.  IPSS = 0. At his visit in August 2023, he had completed his antibiotics.  He was not having any lower urinary tract symptoms.  No dysuria or gross hematuria. IPSS = 1. PSA 8/23:  3.2 PSA 11/23: 2.9  He returns today for follow-up.  He is not having any lower urinary tract symptoms at the present time.  No dysuria or gross hematuria.  No UTIs since his last visit. IPSS = 1 today.  Portions of the above documentation were copied from a prior visit for review purposes only.   Past Medical History:  Past Medical History:  Diagnosis Date   Allergy    GERD (gastroesophageal reflux disease)    Heart murmur    AS CHILD ONLY - OUT GREW   Hyperlipidemia    Hypertension      Past Surgical History:  Past Surgical History:  Procedure Laterality Date   COLONOSCOPY  2011    Allergies:  No Known Allergies  Family History:  No family history on file.  Social History:  Social History   Tobacco Use   Smoking status: Never   Smokeless tobacco: Current    Types: Chew   Tobacco comments:    SELDOM CHEW USE   Substance Use Topics   Alcohol use: No   Drug use: No    ROS: Constitutional:  Negative for fever, chills, weight loss CV: Negative for chest pain, previous MI, hypertension Respiratory:  Negative for shortness of breath, wheezing, sleep apnea, frequent cough GI:  Negative for nausea, vomiting, bloody stool, GERD  Physical exam: There were no vitals taken for this visit. GENERAL APPEARANCE:  Well appearing, well developed, well nourished, NAD HEENT:  Atraumatic, normocephalic, oropharynx clear NECK:  Supple without lymphadenopathy or thyromegaly ABDOMEN:  Soft, non-tender, no masses EXTREMITIES:  Moves all extremities well, without clubbing, cyanosis, or edema NEUROLOGIC:  Alert and oriented x 3, normal gait, CN II-XII grossly intact MENTAL STATUS:  appropriate BACK:  Non-tender to palpation, No CVAT SKIN:  Warm, dry, and intact   Results: U/A:

## 2022-07-26 ENCOUNTER — Ambulatory Visit: Payer: Federal, State, Local not specified - PPO | Admitting: Urology

## 2022-07-26 ENCOUNTER — Encounter: Payer: Self-pay | Admitting: Urology

## 2022-07-26 VITALS — BP 134/76 | HR 90 | Ht 69.0 in | Wt 212.0 lb

## 2022-07-26 DIAGNOSIS — Z09 Encounter for follow-up examination after completed treatment for conditions other than malignant neoplasm: Secondary | ICD-10-CM

## 2022-07-26 DIAGNOSIS — Z87440 Personal history of urinary (tract) infections: Secondary | ICD-10-CM

## 2022-07-26 DIAGNOSIS — R972 Elevated prostate specific antigen [PSA]: Secondary | ICD-10-CM | POA: Diagnosis not present

## 2022-07-26 DIAGNOSIS — Z87898 Personal history of other specified conditions: Secondary | ICD-10-CM

## 2022-07-26 LAB — URINALYSIS, ROUTINE W REFLEX MICROSCOPIC
Bilirubin, UA: NEGATIVE
Glucose, UA: NEGATIVE
Ketones, UA: NEGATIVE
Leukocytes,UA: NEGATIVE
Nitrite, UA: NEGATIVE
Protein,UA: NEGATIVE
RBC, UA: NEGATIVE
Specific Gravity, UA: 1.025 (ref 1.005–1.030)
Urobilinogen, Ur: 1 mg/dL (ref 0.2–1.0)
pH, UA: 6 (ref 5.0–7.5)

## 2022-07-26 NOTE — Progress Notes (Signed)
Assessment: 1. History of elevated PSA   2. History of UTI     Plan: PSA today Return to office in 1 year  Chief Complaint:  Chief Complaint  Patient presents with   Elevated PSA    History of Present Illness:  Micheal Potts is a 62 y.o. year old male who is seen for further evaluation of elevated PSA and history of UTI. He was diagnosed with a UTI at urgent care in April 2023.  He was having symptoms of dysuria at that time.  He was treated with an antibiotic for 1 week.  No lab results available.  His dysuria improved but did not completely resolve.  Urinalysis from 06/20/2021 showed 5-10 WBCs, rare bacteria.   PSA results: 3/22 1.99 4/22 4.4 5/23 7.16 6/23 9.2  He has a prior history of an elevated PSA and underwent a prostate biopsy approximately 20 years ago.  Biopsy was negative by report.  At his initial visit in June 2023, he continued to have some occasional dysuria and occasional urgency.  No gross hematuria. IPSS = 6. Prostate exam demonstrated some tenderness. Urinalysis was nitrite positive.  Urine culture grew >100 K E. coli.  He was treated with Bactrim DS for 20 days. At his visit in July 2023, he continued on his Bactrim DS.  He noted improvement in his urinary symptoms.  No dysuria or gross hematuria.  IPSS = 0. At his visit in August 2023, he had completed his antibiotics.  He was not having any lower urinary tract symptoms.  No dysuria or gross hematuria. IPSS = 1. PSA 8/23:  3.2 PSA 11/23: 2.9  He was having any lower urinary tract symptoms or UTI symptoms at his visit in 11/23. IPSS = 1  He returns today for follow-up.  He is doing well from a urinary standpoint.  No lower urinary tract symptoms.  No dysuria or gross hematuria. IPSS = 0 today.  Portions of the above documentation were copied from a prior visit for review purposes only.   Past Medical History:  Past Medical History:  Diagnosis Date   Allergy    GERD (gastroesophageal reflux  disease)    Heart murmur    AS CHILD ONLY - OUT GREW   Hyperlipidemia    Hypertension     Past Surgical History:  Past Surgical History:  Procedure Laterality Date   COLONOSCOPY  2011    Allergies:  No Known Allergies  Family History:  No family history on file.  Social History:  Social History   Tobacco Use   Smoking status: Never   Smokeless tobacco: Current    Types: Chew   Tobacco comments:    SELDOM CHEW USE   Substance Use Topics   Alcohol use: No   Drug use: No    ROS: Constitutional:  Negative for fever, chills, weight loss CV: Negative for chest pain, previous MI, hypertension Respiratory:  Negative for shortness of breath, wheezing, sleep apnea, frequent cough GI:  Negative for nausea, vomiting, bloody stool, GERD  Physical exam: BP 134/76   Pulse 90   Ht 5\' 9"  (1.753 m)   Wt 212 lb (96.2 kg)   BMI 31.31 kg/m  GENERAL APPEARANCE:  Well appearing, well developed, well nourished, NAD HEENT:  Atraumatic, normocephalic, oropharynx clear NECK:  Supple without lymphadenopathy or thyromegaly ABDOMEN:  Soft, non-tender, no masses EXTREMITIES:  Moves all extremities well, without clubbing, cyanosis, or edema NEUROLOGIC:  Alert and oriented x 3, normal gait, CN II-XII  grossly intact MENTAL STATUS:  appropriate BACK:  Non-tender to palpation, No CVAT SKIN:  Warm, dry, and intact GU: Prostate: 40 g, NT, no nodules Rectum: Normal tone,  no masses or tenderness    Results: U/A: negative

## 2022-07-27 LAB — PSA: Prostate Specific Ag, Serum: 5.4 ng/mL — ABNORMAL HIGH (ref 0.0–4.0)

## 2022-08-15 ENCOUNTER — Telehealth: Payer: Self-pay | Admitting: Urology

## 2022-08-15 NOTE — Telephone Encounter (Signed)
Anytime. Pt is coming in for MyProstateScore test. He will need urine sample AFTER prostate exam.

## 2022-08-15 NOTE — Telephone Encounter (Signed)
Was told by Dr Pete Glatter that he needs to do another Urine test and examination. He was told if he had not heard from [place] in two weeks to call here.  What and when should I sch patient for.

## 2022-08-16 ENCOUNTER — Encounter: Payer: Self-pay | Admitting: Urology

## 2022-08-16 ENCOUNTER — Ambulatory Visit (INDEPENDENT_AMBULATORY_CARE_PROVIDER_SITE_OTHER): Payer: Federal, State, Local not specified - PPO | Admitting: Urology

## 2022-08-16 VITALS — BP 120/77 | HR 76 | Ht 69.5 in | Wt 189.0 lb

## 2022-08-16 DIAGNOSIS — R972 Elevated prostate specific antigen [PSA]: Secondary | ICD-10-CM | POA: Diagnosis not present

## 2022-08-16 NOTE — Progress Notes (Signed)
   Assessment: 1. Elevated PSA     Plan: MyProstateScore 2.0 sent today - will call with results  Chief Complaint:  Chief Complaint  Patient presents with   Elevated PSA    History of Present Illness:  Micheal Potts is a 62 y.o. year old male who is seen for further evaluation of elevated PSA and history of UTI. He was diagnosed with a UTI at urgent care in April 2023.  He was having symptoms of dysuria at that time.  He was treated with an antibiotic for 1 week.  No lab results available.  His dysuria improved but did not completely resolve.  Urinalysis from 06/20/2021 showed 5-10 WBCs, rare bacteria.   PSA results: 3/22 1.99 4/22 4.4 5/23 7.16 6/23 9.2  He has a prior history of an elevated PSA and underwent a prostate biopsy approximately 20 years ago.  Biopsy was negative by report.  At his initial visit in June 2023, he continued to have some occasional dysuria and occasional urgency.  No gross hematuria. IPSS = 6. Prostate exam demonstrated some tenderness. Urinalysis was nitrite positive.  Urine culture grew >100 K E. coli.  He was treated with Bactrim DS for 20 days. At his visit in July 2023, he continued on his Bactrim DS.  He noted improvement in his urinary symptoms.  No dysuria or gross hematuria.  IPSS = 0. At his visit in August 2023, he had completed his antibiotics.  He was not having any lower urinary tract symptoms.  No dysuria or gross hematuria. IPSS = 1. PSA 8/23:  3.2 PSA 11/23: 2.9  He was not having any lower urinary tract symptoms or UTI symptoms at his visit in 11/23. IPSS = 1  PSA from 07/26/22:  5.4  He presents today for urine specimen for MyProstateScore 2.0. No new urinary symptoms.  No dysuria or gross hematuria. IPSS = 0 today.  Portions of the above documentation were copied from a prior visit for review purposes only.   Past Medical History:  Past Medical History:  Diagnosis Date   Allergy    GERD (gastroesophageal reflux disease)     Heart murmur    AS CHILD ONLY - OUT GREW   Hyperlipidemia    Hypertension     Past Surgical History:  Past Surgical History:  Procedure Laterality Date   COLONOSCOPY  2011    Allergies:  No Known Allergies  Family History:  No family history on file.  Social History:  Social History   Tobacco Use   Smoking status: Never   Smokeless tobacco: Current    Types: Chew   Tobacco comments:    SELDOM CHEW USE   Substance Use Topics   Alcohol use: No   Drug use: No    ROS: Constitutional:  Negative for fever, chills, weight loss CV: Negative for chest pain, previous MI, hypertension Respiratory:  Negative for shortness of breath, wheezing, sleep apnea, frequent cough GI:  Negative for nausea, vomiting, bloody stool, GERD  Physical exam: BP 120/77   Pulse 76   Ht 5' 9.5" (1.765 m)   Wt 189 lb (85.7 kg)   BMI 27.51 kg/m  GU:  prostate exam performed prior to obtaining urine specimen for MyProstateScore 2.0   Results: None

## 2022-08-30 ENCOUNTER — Encounter: Payer: Self-pay | Admitting: Urology

## 2022-09-04 ENCOUNTER — Telehealth: Payer: Self-pay | Admitting: Urology

## 2022-09-04 NOTE — Telephone Encounter (Signed)
Returned pts call. Advised pt results were in a message in his mychart. Pt states he is having difficulty getting into his mychart. Read pt the message, made 6 mo lab appt to recheck PSA, appt reminder mailed to pts home. Pt expressed understanding.

## 2022-09-04 NOTE — Telephone Encounter (Signed)
 Calling for results.

## 2022-09-09 DIAGNOSIS — J019 Acute sinusitis, unspecified: Secondary | ICD-10-CM | POA: Diagnosis not present

## 2022-11-22 ENCOUNTER — Other Ambulatory Visit: Payer: Self-pay | Admitting: Urology

## 2022-11-22 DIAGNOSIS — R8290 Unspecified abnormal findings in urine: Secondary | ICD-10-CM

## 2022-11-22 DIAGNOSIS — N390 Urinary tract infection, site not specified: Secondary | ICD-10-CM

## 2023-02-08 ENCOUNTER — Other Ambulatory Visit: Payer: Self-pay

## 2023-02-08 DIAGNOSIS — R972 Elevated prostate specific antigen [PSA]: Secondary | ICD-10-CM

## 2023-02-08 DIAGNOSIS — Z87898 Personal history of other specified conditions: Secondary | ICD-10-CM

## 2023-03-07 ENCOUNTER — Other Ambulatory Visit: Payer: Federal, State, Local not specified - PPO

## 2023-03-15 ENCOUNTER — Other Ambulatory Visit: Payer: Federal, State, Local not specified - PPO

## 2023-03-19 ENCOUNTER — Other Ambulatory Visit: Payer: Federal, State, Local not specified - PPO

## 2023-06-21 DIAGNOSIS — R7303 Prediabetes: Secondary | ICD-10-CM | POA: Diagnosis not present

## 2023-06-21 DIAGNOSIS — Z125 Encounter for screening for malignant neoplasm of prostate: Secondary | ICD-10-CM | POA: Diagnosis not present

## 2023-06-21 DIAGNOSIS — I10 Essential (primary) hypertension: Secondary | ICD-10-CM | POA: Diagnosis not present

## 2023-06-21 DIAGNOSIS — Z0000 Encounter for general adult medical examination without abnormal findings: Secondary | ICD-10-CM | POA: Diagnosis not present

## 2023-06-24 IMAGING — CT CT CARDIAC CORONARY ARTERY CALCIUM SCORE
3 series · 14 of 20 positions shown, 16 images · non-contrast
Comparison: None.

CLINICAL DATA: 60-year-old African American male with elevated
cholesterol.

EXAM:
CT CARDIAC CORONARY ARTERY CALCIUM SCORE
TECHNIQUE: Non-contrast imaging through the heart was performed using
prospective ECG gating. Image post processing was performed on an
independent workstation, allowing for quantitative analysis of the
heart and coronary arteries. Note that this exam targets the heart
and the chest was not imaged in its entirety.

[Series 2: calcium scoring 2.00 qr36 bestdiast 71% hrt calciu · axial · 0.36mm/px · z∈[+1664,+1740]mm · 4 of 64 slices shown]
[im 13/64  vessel]
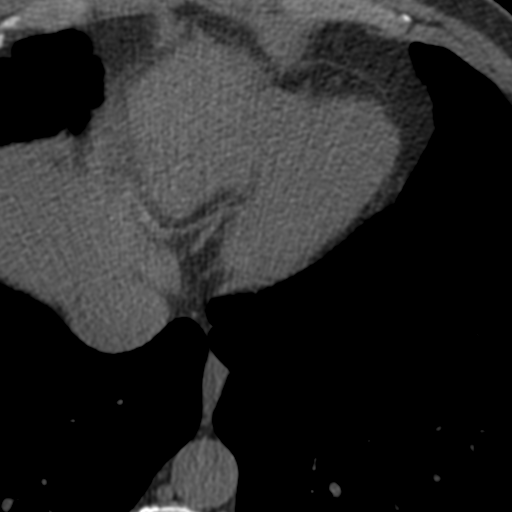
[im 26/64  vessel]
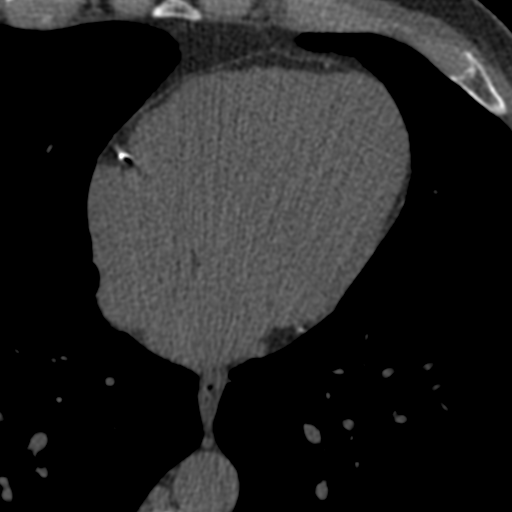
[im 38/64  vessel]
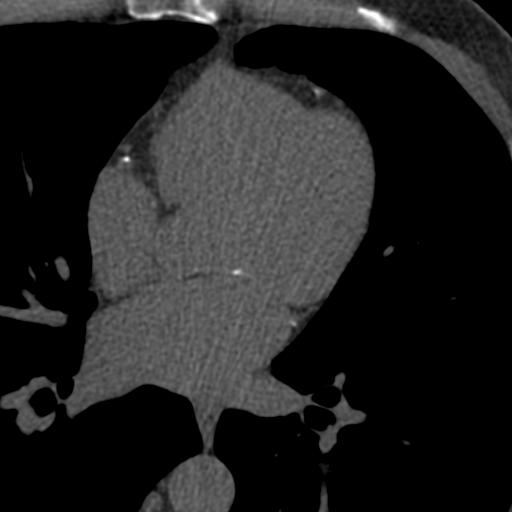
[im 51/64  vessel]
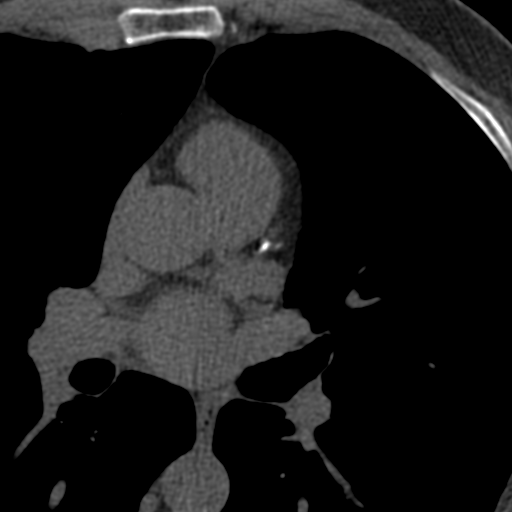

[Series 3: calcium scoring 2.00 br40 bestdiast 71% axial · axial · 0.58mm/px · z∈[+1660,+1744]mm · 5 of 64 slices shown, 7 images]
[im 11/64  vessel]
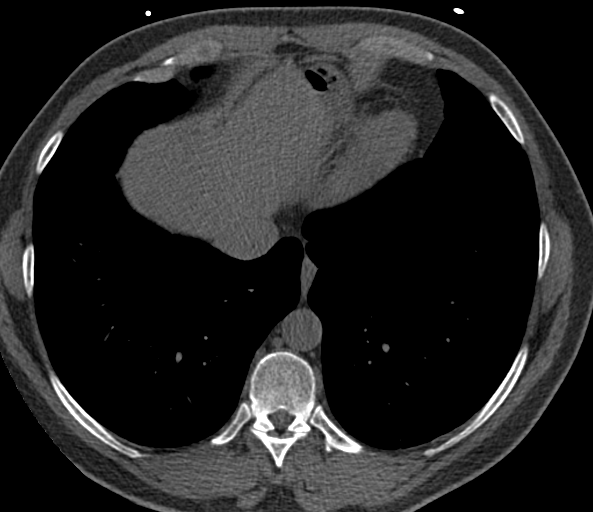
[im 11/64  lung]
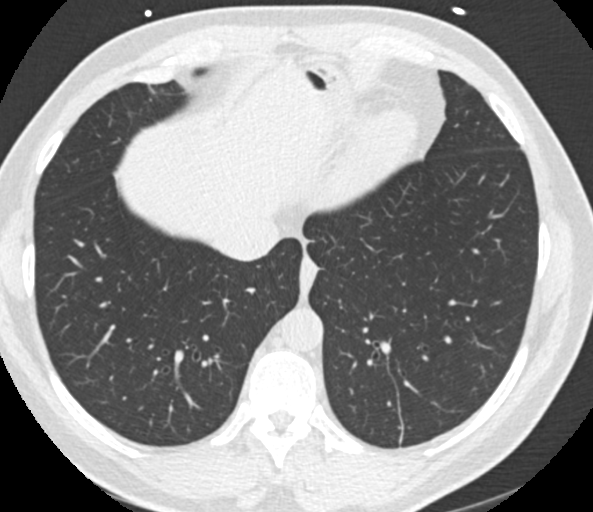
[im 22/64  vessel]
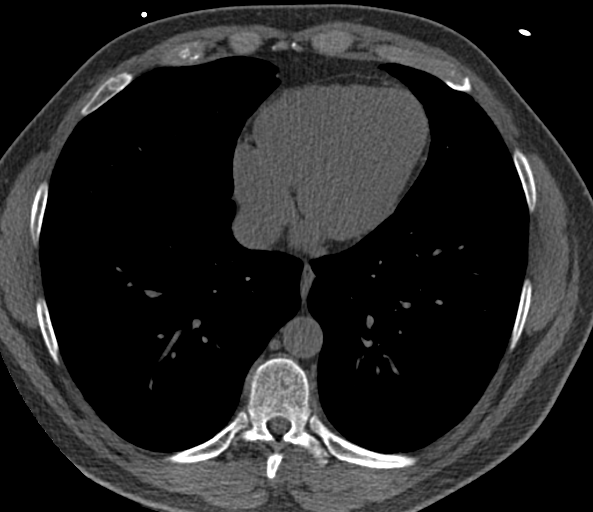
[im 32/64  vessel]
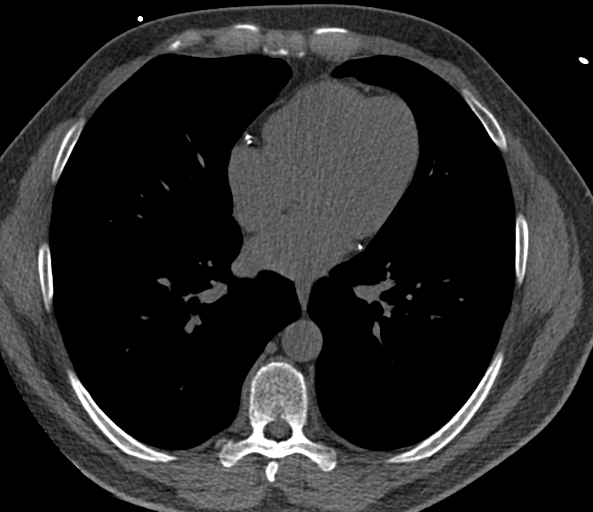
[im 43/64  vessel]
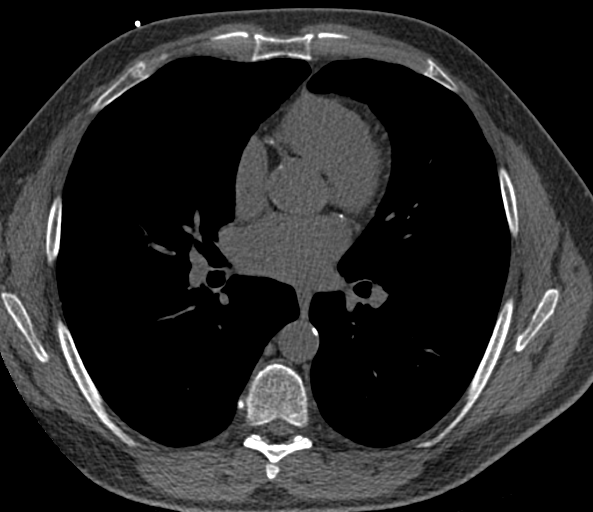
[im 53/64  vessel]
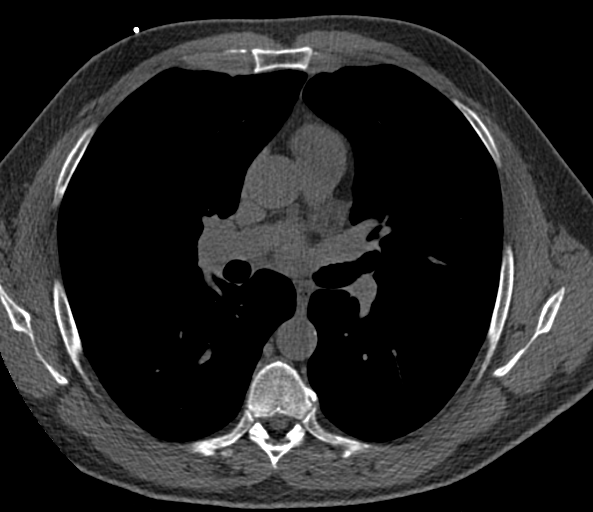
[im 53/64  lung]
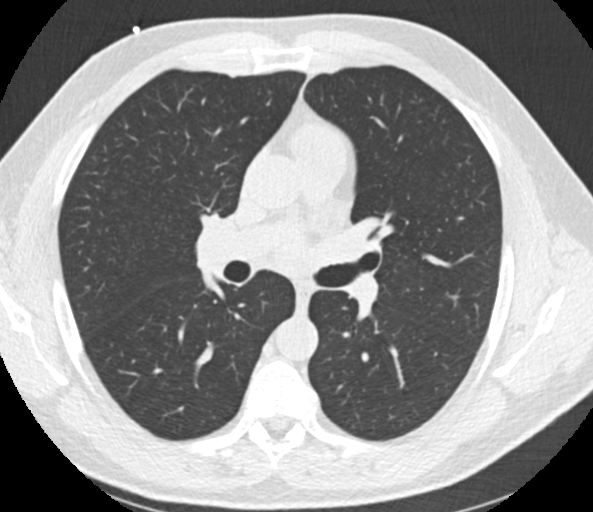

[Series 9: calcium scoring 2.00 br60 bestdiast 71% lungs · axial · 0.58mm/px · z∈[+1660,+1744]mm · 5 of 64 slices shown]
[im 11/64  vessel]
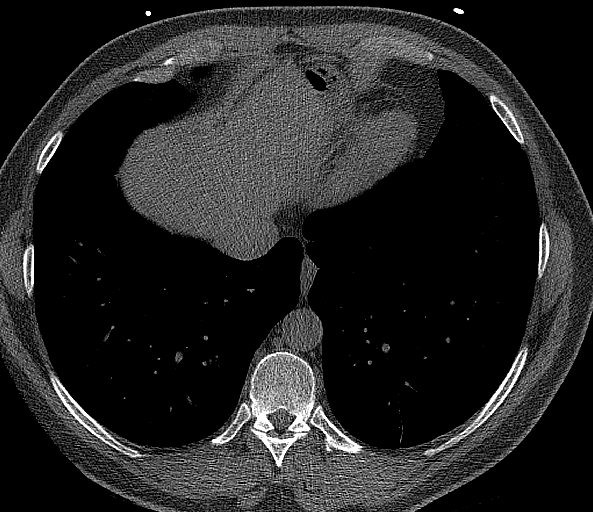
[im 22/64  vessel]
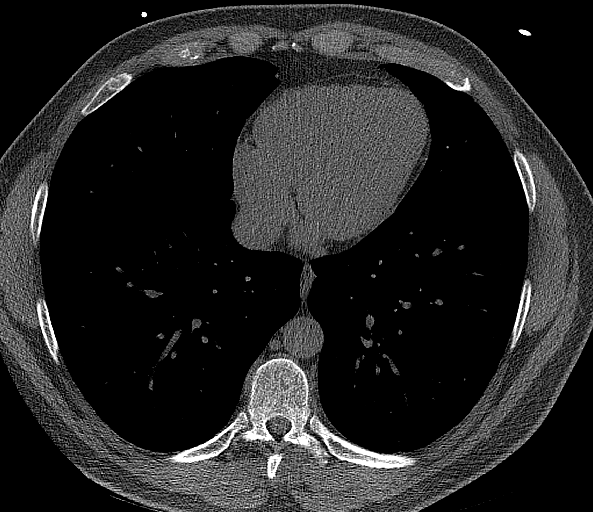
[im 32/64  vessel]
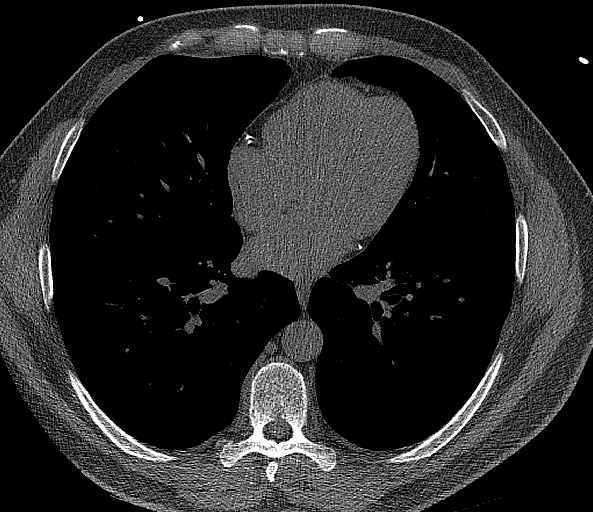
[im 43/64  vessel]
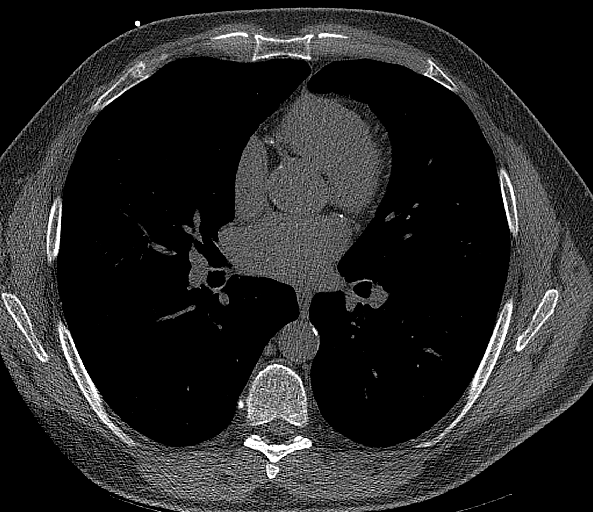
[im 53/64  vessel]
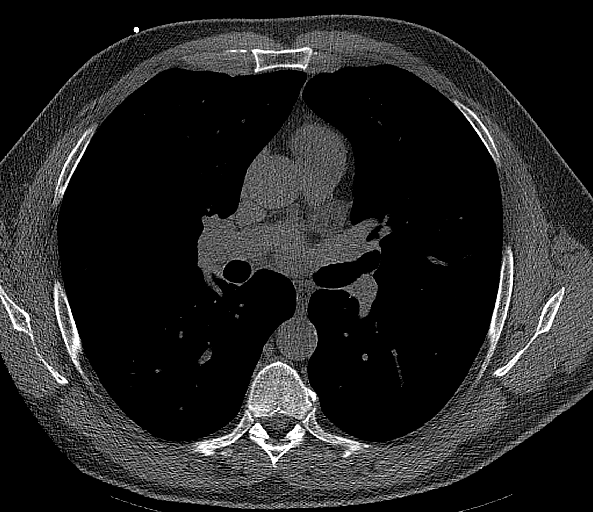

[14 of 20 positions shown; findings below may reference images not displayed]

FINDINGS: CORONARY CALCIUM SCORES:

Left Main: 0

LAD: 231

LCx: 221

RCA: 196

Total Agatston Score: 647

[HOSPITAL] percentile: 97

AORTA MEASUREMENTS:

Ascending Aorta: 32 mm

Descending Aorta: 24 mm

OTHER FINDINGS:

Heart size is normal. No significant pericardial fluid. Visualized
mediastinal structures are unremarkable. Images of the upper abdomen
are unremarkable. Small amount of calcium involving the descending
thoracic aorta. Visualized lungs are clear. Linear density in the
posterior left lung is suggestive for atelectasis or mild scarring.
No large pleural effusions. No acute bone abnormality.
IMPRESSION: Age advanced coronary artery calcium. Coronary calcium score is 647
and this is at percentile 97 for patients of the same age, gender
and ethnicity.

Aortic Atherosclerosis ([id number]-ED8.8).

## 2023-06-26 DIAGNOSIS — I2510 Atherosclerotic heart disease of native coronary artery without angina pectoris: Secondary | ICD-10-CM | POA: Diagnosis not present

## 2023-06-26 DIAGNOSIS — Z0000 Encounter for general adult medical examination without abnormal findings: Secondary | ICD-10-CM | POA: Diagnosis not present

## 2023-06-26 DIAGNOSIS — R7303 Prediabetes: Secondary | ICD-10-CM | POA: Diagnosis not present

## 2023-06-26 DIAGNOSIS — J309 Allergic rhinitis, unspecified: Secondary | ICD-10-CM | POA: Diagnosis not present

## 2023-06-26 DIAGNOSIS — I10 Essential (primary) hypertension: Secondary | ICD-10-CM | POA: Diagnosis not present

## 2023-06-28 DIAGNOSIS — I2510 Atherosclerotic heart disease of native coronary artery without angina pectoris: Secondary | ICD-10-CM | POA: Diagnosis not present

## 2023-06-28 DIAGNOSIS — E7800 Pure hypercholesterolemia, unspecified: Secondary | ICD-10-CM | POA: Diagnosis not present

## 2023-06-28 DIAGNOSIS — E118 Type 2 diabetes mellitus with unspecified complications: Secondary | ICD-10-CM | POA: Diagnosis not present

## 2023-06-28 DIAGNOSIS — I10 Essential (primary) hypertension: Secondary | ICD-10-CM | POA: Diagnosis not present

## 2023-07-26 ENCOUNTER — Ambulatory Visit: Payer: Federal, State, Local not specified - PPO | Admitting: Urology

## 2023-07-26 NOTE — Progress Notes (Deleted)
   Assessment: 1. Elevated PSA    Plan: PSA today  Chief Complaint:  No chief complaint on file.   History of Present Illness:  Micheal Potts is a 63 y.o. year old male who is seen for further evaluation of elevated PSA and history of UTI. He was diagnosed with a UTI at urgent care in April 2023.  He was having symptoms of dysuria at that time.  He was treated with an antibiotic for 1 week.  No lab results available.  His dysuria improved but did not completely resolve.  Urinalysis from 06/20/2021 showed 5-10 WBCs, rare bacteria.   PSA results: 3/22 1.99 4/22 4.4 5/23 7.16 6/23 9.2  He has a prior history of an elevated PSA and underwent a prostate biopsy approximately 20 years ago.  Biopsy was negative by report.  At his initial visit in June 2023, he continued to have some occasional dysuria and occasional urgency.  No gross hematuria. IPSS = 6. Prostate exam demonstrated some tenderness. Urinalysis was nitrite positive.  Urine culture grew >100 K E. coli.  He was treated with Bactrim  DS for 20 days. At his visit in July 2023, he continued on his Bactrim  DS.  He noted improvement in his urinary symptoms.  No dysuria or gross hematuria.  IPSS = 0. At his visit in August 2023, he had completed his antibiotics.  He was not having any lower urinary tract symptoms.  No dysuria or gross hematuria. IPSS = 1. PSA 8/23:  3.2 PSA 11/23: 2.9  He was not having any lower urinary tract symptoms or UTI symptoms at his visit in 11/23. IPSS = 1  PSA from 07/26/22:  5.4 MPS2.0 6/24:  4.7% risk of aggressive prostate cancer on biopsy.  Portions of the above documentation were copied from a prior visit for review purposes only.   Past Medical History:  Past Medical History:  Diagnosis Date   Allergy    GERD (gastroesophageal reflux disease)    Heart murmur    AS CHILD ONLY - OUT GREW   Hyperlipidemia    Hypertension     Past Surgical History:  Past Surgical History:  Procedure  Laterality Date   COLONOSCOPY  2011    Allergies:  No Known Allergies  Family History:  No family history on file.  Social History:  Social History   Tobacco Use   Smoking status: Never   Smokeless tobacco: Current    Types: Chew   Tobacco comments:    SELDOM CHEW USE   Substance Use Topics   Alcohol use: No   Drug use: No    ROS: Constitutional:  Negative for fever, chills, weight loss CV: Negative for chest pain, previous MI, hypertension Respiratory:  Negative for shortness of breath, wheezing, sleep apnea, frequent cough GI:  Negative for nausea, vomiting, bloody stool, GERD  Physical exam: There were no vitals taken for this visit. GENERAL APPEARANCE:  Well appearing, well developed, well nourished, NAD HEENT:  Atraumatic, normocephalic, oropharynx clear NECK:  Supple without lymphadenopathy or thyromegaly ABDOMEN:  Soft, non-tender, no masses EXTREMITIES:  Moves all extremities well, without clubbing, cyanosis, or edema NEUROLOGIC:  Alert and oriented x 3, normal gait, CN II-XII grossly intact MENTAL STATUS:  appropriate BACK:  Non-tender to palpation, No CVAT SKIN:  Warm, dry, and intact GU: Prostate: {Exam; prostate:5793} Rectum: {rectal exam:26517}   Results: U/A:

## 2023-10-31 ENCOUNTER — Other Ambulatory Visit (HOSPITAL_COMMUNITY): Payer: Self-pay

## 2023-10-31 MED ORDER — MOUNJARO 10 MG/0.5ML ~~LOC~~ SOAJ
10.0000 mg | SUBCUTANEOUS | 0 refills | Status: DC
  Filled 2023-10-31 – 2023-11-20 (×3): qty 2, 28d supply, fill #0

## 2023-10-31 MED ORDER — MOUNJARO 12.5 MG/0.5ML ~~LOC~~ SOAJ
12.5000 mg | SUBCUTANEOUS | 0 refills | Status: AC
  Filled 2023-10-31: qty 2, 28d supply, fill #0

## 2023-11-01 ENCOUNTER — Other Ambulatory Visit (HOSPITAL_COMMUNITY): Payer: Self-pay

## 2023-11-13 ENCOUNTER — Other Ambulatory Visit (HOSPITAL_COMMUNITY): Payer: Self-pay

## 2023-11-20 ENCOUNTER — Other Ambulatory Visit (HOSPITAL_COMMUNITY): Payer: Self-pay

## 2023-12-13 ENCOUNTER — Other Ambulatory Visit (HOSPITAL_COMMUNITY): Payer: Self-pay

## 2023-12-13 MED ORDER — MOUNJARO 10 MG/0.5ML ~~LOC~~ SOAJ
10.0000 mg | SUBCUTANEOUS | 2 refills | Status: AC
  Filled 2023-12-13: qty 2, 28d supply, fill #0
  Filled 2024-01-11: qty 2, 28d supply, fill #1

## 2024-01-16 ENCOUNTER — Other Ambulatory Visit: Payer: Self-pay

## 2024-01-16 ENCOUNTER — Other Ambulatory Visit (HOSPITAL_COMMUNITY): Payer: Self-pay

## 2024-02-07 ENCOUNTER — Other Ambulatory Visit (HOSPITAL_COMMUNITY): Payer: Self-pay

## 2024-02-07 MED ORDER — MOUNJARO 15 MG/0.5ML ~~LOC~~ SOAJ
15.0000 mg | SUBCUTANEOUS | 3 refills | Status: AC
  Filled 2024-02-07: qty 2, 28d supply, fill #0
  Filled 2024-03-12 – 2024-03-13 (×2): qty 2, 28d supply, fill #1

## 2024-02-07 MED ORDER — MOUNJARO 12.5 MG/0.5ML ~~LOC~~ SOAJ
12.5000 mg | SUBCUTANEOUS | 0 refills | Status: AC
  Filled 2024-02-07: qty 2, 28d supply, fill #0

## 2024-02-08 ENCOUNTER — Other Ambulatory Visit: Payer: Self-pay

## 2024-02-13 ENCOUNTER — Other Ambulatory Visit (HOSPITAL_COMMUNITY): Payer: Self-pay

## 2024-03-12 ENCOUNTER — Other Ambulatory Visit (HOSPITAL_COMMUNITY): Payer: Self-pay

## 2024-03-13 ENCOUNTER — Other Ambulatory Visit (HOSPITAL_COMMUNITY): Payer: Self-pay

## 2024-03-14 ENCOUNTER — Other Ambulatory Visit (HOSPITAL_COMMUNITY): Payer: Self-pay
